# Patient Record
Sex: Female | Born: 1945 | Race: Black or African American | Hispanic: No | Smoking: Former smoker
Health system: Southern US, Community
[De-identification: ages and names within clinical notes are randomized; demographics above are authoritative.]

## PROBLEM LIST (undated history)

## (undated) DIAGNOSIS — N289 Disorder of kidney and ureter, unspecified: Secondary | ICD-10-CM

## (undated) DIAGNOSIS — I8290 Acute embolism and thrombosis of unspecified vein: Secondary | ICD-10-CM

## (undated) DIAGNOSIS — J45909 Unspecified asthma, uncomplicated: Secondary | ICD-10-CM

## (undated) DIAGNOSIS — M545 Low back pain, unspecified: Secondary | ICD-10-CM

## (undated) DIAGNOSIS — K219 Gastro-esophageal reflux disease without esophagitis: Secondary | ICD-10-CM

## (undated) DIAGNOSIS — M1990 Unspecified osteoarthritis, unspecified site: Secondary | ICD-10-CM

## (undated) DIAGNOSIS — Z8659 Personal history of other mental and behavioral disorders: Secondary | ICD-10-CM

## (undated) DIAGNOSIS — Z862 Personal history of diseases of the blood and blood-forming organs and certain disorders involving the immune mechanism: Secondary | ICD-10-CM

## (undated) DIAGNOSIS — C801 Malignant (primary) neoplasm, unspecified: Secondary | ICD-10-CM

## (undated) DIAGNOSIS — R519 Headache, unspecified: Secondary | ICD-10-CM

## (undated) DIAGNOSIS — Z8719 Personal history of other diseases of the digestive system: Secondary | ICD-10-CM

## (undated) DIAGNOSIS — R109 Unspecified abdominal pain: Secondary | ICD-10-CM

## (undated) DIAGNOSIS — R011 Cardiac murmur, unspecified: Secondary | ICD-10-CM

## (undated) DIAGNOSIS — R51 Headache: Secondary | ICD-10-CM

## (undated) DIAGNOSIS — I10 Essential (primary) hypertension: Secondary | ICD-10-CM

## (undated) DIAGNOSIS — I639 Cerebral infarction, unspecified: Secondary | ICD-10-CM

## (undated) DIAGNOSIS — E669 Obesity, unspecified: Secondary | ICD-10-CM

## (undated) HISTORY — PX: LEG SURGERY: SHX1003

## (undated) HISTORY — DX: Personal history of other mental and behavioral disorders: Z86.59

## (undated) HISTORY — PX: BILATERAL CARPAL TUNNEL RELEASE: SHX6508

## (undated) HISTORY — PX: UTERINE FIBROID SURGERY: SHX826

## (undated) HISTORY — DX: Personal history of diseases of the blood and blood-forming organs and certain disorders involving the immune mechanism: Z86.2

## (undated) HISTORY — DX: Obesity, unspecified: E66.9

## (undated) HISTORY — DX: Unspecified abdominal pain: R10.9

## (undated) HISTORY — PX: BREAST SURGERY: SHX581

## (undated) HISTORY — PX: GLAUCOMA SURGERY: SHX656

## (undated) HISTORY — DX: Low back pain: M54.5

## (undated) HISTORY — DX: Essential (primary) hypertension: I10

## (undated) HISTORY — DX: Unspecified asthma, uncomplicated: J45.909

## (undated) HISTORY — DX: Low back pain, unspecified: M54.50

---

## 1968-11-18 HISTORY — PX: BREAST EXCISIONAL BIOPSY: SUR124

## 1998-12-28 ENCOUNTER — Other Ambulatory Visit: Admission: RE | Admit: 1998-12-28 | Discharge: 1998-12-28 | Payer: Self-pay | Admitting: Internal Medicine

## 1999-04-19 ENCOUNTER — Encounter: Payer: Self-pay | Admitting: Internal Medicine

## 1999-04-19 ENCOUNTER — Ambulatory Visit (HOSPITAL_COMMUNITY): Admission: RE | Admit: 1999-04-19 | Discharge: 1999-04-19 | Payer: Self-pay | Admitting: Internal Medicine

## 1999-05-20 ENCOUNTER — Emergency Department (HOSPITAL_COMMUNITY): Admission: EM | Admit: 1999-05-20 | Discharge: 1999-05-20 | Payer: Self-pay | Admitting: Emergency Medicine

## 1999-07-19 ENCOUNTER — Encounter (INDEPENDENT_AMBULATORY_CARE_PROVIDER_SITE_OTHER): Payer: Self-pay | Admitting: Specialist

## 1999-07-19 ENCOUNTER — Other Ambulatory Visit: Admission: RE | Admit: 1999-07-19 | Discharge: 1999-07-19 | Payer: Self-pay | Admitting: Otolaryngology

## 2000-06-10 ENCOUNTER — Encounter: Payer: Self-pay | Admitting: Internal Medicine

## 2000-06-10 ENCOUNTER — Encounter: Admission: RE | Admit: 2000-06-10 | Discharge: 2000-06-10 | Payer: Self-pay | Admitting: Internal Medicine

## 2000-06-17 ENCOUNTER — Ambulatory Visit (HOSPITAL_COMMUNITY): Admission: RE | Admit: 2000-06-17 | Discharge: 2000-06-17 | Payer: Self-pay | Admitting: Internal Medicine

## 2001-03-26 ENCOUNTER — Other Ambulatory Visit: Admission: RE | Admit: 2001-03-26 | Discharge: 2001-03-26 | Payer: Self-pay | Admitting: Internal Medicine

## 2001-04-03 ENCOUNTER — Ambulatory Visit (HOSPITAL_BASED_OUTPATIENT_CLINIC_OR_DEPARTMENT_OTHER): Admission: RE | Admit: 2001-04-03 | Discharge: 2001-04-03 | Payer: Self-pay | Admitting: Internal Medicine

## 2001-06-23 ENCOUNTER — Ambulatory Visit (HOSPITAL_COMMUNITY): Admission: RE | Admit: 2001-06-23 | Discharge: 2001-06-23 | Payer: Self-pay | Admitting: Internal Medicine

## 2001-06-23 ENCOUNTER — Encounter: Payer: Self-pay | Admitting: Internal Medicine

## 2002-08-20 ENCOUNTER — Encounter: Payer: Self-pay | Admitting: Internal Medicine

## 2002-08-20 ENCOUNTER — Ambulatory Visit (HOSPITAL_COMMUNITY): Admission: RE | Admit: 2002-08-20 | Discharge: 2002-08-20 | Payer: Self-pay | Admitting: Internal Medicine

## 2003-06-15 ENCOUNTER — Ambulatory Visit (HOSPITAL_BASED_OUTPATIENT_CLINIC_OR_DEPARTMENT_OTHER): Admission: RE | Admit: 2003-06-15 | Discharge: 2003-06-15 | Payer: Self-pay | Admitting: Orthopedic Surgery

## 2003-08-31 ENCOUNTER — Ambulatory Visit (HOSPITAL_COMMUNITY): Admission: RE | Admit: 2003-08-31 | Discharge: 2003-08-31 | Payer: Self-pay | Admitting: Internal Medicine

## 2003-08-31 ENCOUNTER — Encounter: Payer: Self-pay | Admitting: Internal Medicine

## 2004-09-19 ENCOUNTER — Ambulatory Visit (HOSPITAL_COMMUNITY): Admission: RE | Admit: 2004-09-19 | Discharge: 2004-09-19 | Payer: Self-pay | Admitting: Internal Medicine

## 2004-12-11 ENCOUNTER — Other Ambulatory Visit: Admission: RE | Admit: 2004-12-11 | Discharge: 2004-12-11 | Payer: Self-pay | Admitting: Internal Medicine

## 2005-02-19 ENCOUNTER — Ambulatory Visit (HOSPITAL_COMMUNITY): Admission: RE | Admit: 2005-02-19 | Discharge: 2005-02-19 | Payer: Self-pay | Admitting: Gastroenterology

## 2005-10-14 ENCOUNTER — Ambulatory Visit (HOSPITAL_COMMUNITY): Admission: RE | Admit: 2005-10-14 | Discharge: 2005-10-14 | Payer: Self-pay | Admitting: Internal Medicine

## 2006-12-23 ENCOUNTER — Ambulatory Visit (HOSPITAL_COMMUNITY): Admission: RE | Admit: 2006-12-23 | Discharge: 2006-12-23 | Payer: Self-pay | Admitting: Internal Medicine

## 2007-12-29 ENCOUNTER — Ambulatory Visit (HOSPITAL_COMMUNITY): Admission: RE | Admit: 2007-12-29 | Discharge: 2007-12-29 | Payer: Self-pay | Admitting: Internal Medicine

## 2009-01-10 ENCOUNTER — Ambulatory Visit (HOSPITAL_COMMUNITY): Admission: RE | Admit: 2009-01-10 | Discharge: 2009-01-10 | Payer: Self-pay | Admitting: Internal Medicine

## 2009-01-24 ENCOUNTER — Encounter: Admission: RE | Admit: 2009-01-24 | Discharge: 2009-01-24 | Payer: Self-pay | Admitting: Internal Medicine

## 2009-03-08 ENCOUNTER — Encounter (HOSPITAL_COMMUNITY): Admission: RE | Admit: 2009-03-08 | Discharge: 2009-06-06 | Payer: Self-pay | Admitting: Cardiology

## 2009-05-25 ENCOUNTER — Emergency Department (HOSPITAL_COMMUNITY): Admission: EM | Admit: 2009-05-25 | Discharge: 2009-05-25 | Payer: Self-pay | Admitting: Emergency Medicine

## 2010-01-11 ENCOUNTER — Ambulatory Visit (HOSPITAL_COMMUNITY): Admission: RE | Admit: 2010-01-11 | Discharge: 2010-01-11 | Payer: Self-pay | Admitting: Internal Medicine

## 2010-12-03 ENCOUNTER — Encounter
Admission: RE | Admit: 2010-12-03 | Discharge: 2010-12-03 | Payer: Self-pay | Source: Home / Self Care | Attending: Internal Medicine | Admitting: Internal Medicine

## 2010-12-14 ENCOUNTER — Other Ambulatory Visit: Payer: Self-pay | Admitting: Internal Medicine

## 2010-12-14 DIAGNOSIS — Z139 Encounter for screening, unspecified: Secondary | ICD-10-CM

## 2010-12-14 DIAGNOSIS — Z1231 Encounter for screening mammogram for malignant neoplasm of breast: Secondary | ICD-10-CM

## 2011-01-31 ENCOUNTER — Ambulatory Visit (HOSPITAL_COMMUNITY)
Admission: RE | Admit: 2011-01-31 | Discharge: 2011-01-31 | Disposition: A | Discharge status: Home / Self Care | Payer: Medicare Other | Source: Ambulatory Visit | Attending: Internal Medicine | Admitting: Internal Medicine

## 2011-01-31 DIAGNOSIS — Z1231 Encounter for screening mammogram for malignant neoplasm of breast: Secondary | ICD-10-CM | POA: Insufficient documentation

## 2011-02-24 LAB — URINALYSIS, ROUTINE W REFLEX MICROSCOPIC
Bilirubin Urine: NEGATIVE
Glucose, UA: 100 mg/dL — AB
Hgb urine dipstick: NEGATIVE
Ketones, ur: NEGATIVE mg/dL
Leukocytes, UA: NEGATIVE
Nitrite: NEGATIVE
Protein, ur: 100 mg/dL — AB
Specific Gravity, Urine: 1.017 (ref 1.005–1.030)
Urobilinogen, UA: 1 mg/dL (ref 0.0–1.0)
pH: 6.5 (ref 5.0–8.0)

## 2011-02-24 LAB — CBC
HCT: 40.4 % (ref 36.0–46.0)
Hemoglobin: 14.1 g/dL (ref 12.0–15.0)
MCHC: 34.7 g/dL (ref 30.0–36.0)
MCHC: 34.9 g/dL (ref 30.0–36.0)
MCV: 86 fL (ref 78.0–100.0)
Platelets: 314 10*3/uL (ref 150–400)
RBC: 4.7 MIL/uL (ref 3.87–5.11)
RDW: 14.3 % (ref 11.5–15.5)
WBC: 10 10*3/uL (ref 4.0–10.5)

## 2011-02-24 LAB — DIFFERENTIAL
Band Neutrophils: 0 % (ref 0–10)
Basophils Absolute: 0 10*3/uL (ref 0.0–0.1)
Basophils Absolute: 0 10*3/uL (ref 0.0–0.1)
Basophils Relative: 0 % (ref 0–1)
Basophils Relative: 0 % (ref 0–1)
Blasts: 0 %
Eosinophils Absolute: 0 10*3/uL (ref 0.0–0.7)
Eosinophils Relative: 0 % (ref 0–5)
Eosinophils Relative: 0 % (ref 0–5)
Lymphocytes Relative: 18 % (ref 12–46)
Lymphocytes Relative: 7 % — ABNORMAL LOW (ref 12–46)
Lymphs Abs: 1.8 10*3/uL (ref 0.7–4.0)
Metamyelocytes Relative: 0 %
Monocytes Absolute: 0.5 10*3/uL (ref 0.1–1.0)
Monocytes Absolute: 0.6 10*3/uL (ref 0.1–1.0)
Monocytes Relative: 5 % (ref 3–12)
Monocytes Relative: 5 % (ref 3–12)
Myelocytes: 0 %
Neutro Abs: 7.7 10*3/uL (ref 1.7–7.7)
Neutrophils Relative %: 77 % (ref 43–77)
Promyelocytes Absolute: 0 %
Smear Review: ADEQUATE
nRBC: 0 /100 WBC

## 2011-02-24 LAB — COMPREHENSIVE METABOLIC PANEL
AST: 23 U/L (ref 0–37)
Albumin: 3.9 g/dL (ref 3.5–5.2)
Alkaline Phosphatase: 69 U/L (ref 39–117)
Chloride: 102 mEq/L (ref 96–112)
Creatinine, Ser: 0.59 mg/dL (ref 0.4–1.2)
GFR calc Af Amer: >60 mL/min (ref >60)
GFR calc non Af Amer: >60 mL/min (ref >60)
Potassium: 3.4 mEq/L — ABNORMAL LOW (ref 3.5–5.1)
Sodium: 143 mEq/L (ref 135–145)

## 2011-02-24 LAB — URINE MICROSCOPIC-ADD ON

## 2011-04-05 NOTE — Op Note (Signed)
Deanna Rose, Deanna Rose          ACCOUNT NO.:  [id number]   MEDICAL RECORD NO.:  [id number]          PATIENT TYPE:  AMB   LOCATION:  ENDO                         FACILITY:  [facility]   PHYSICIAN:  James L. Malon Kindle., M.D.DATE OF BIRTH:  Jan 07, 1946   DATE OF PROCEDURE:  02/19/2005  DATE OF DISCHARGE:                                 OPERATIVE REPORT   PROCEDURE:  Colonoscopy.   ENDOSCOPIST:  Llana Aliment. Edwards, M.D.   MEDICATIONS:  Fentanyl 80 mcg, Versed 8 mg IV.   SCOPE:  Olympus pediatric adjustable colonoscope.   INDICATIONS FOR PROCEDURE:  Colon cancer screening.   DESCRIPTION OF PROCEDURE:  The procedure had been explained to the patient  and a consent obtained.  With the patient in the left lateral decubitus  position, the Olympus scope was inserted and advanced.  The prep was  excellent.  We reached the cecum without difficulty.  The ileocecal valve  and appendiceal orifice were seen.  The scope was withdrawn in the cecum.  The ascending colon, transverse colon, splenic flexure, descending and  sigmoid colon were seen well.  No polyps were seen.  No significant  diverticulosis.  The rectum was free of polys.  The scope was withdrawn.   The patient tolerated the procedure well.   ASSESSMENT:  Normal screening colonoscopy - code #V76.51.   PLAN:  Will recommend yearly Hemoccults.  Possibly repeat the procedure in  10 years, or for specific indications.      JLE/MEDQ  D:  02/19/2005  T:  02/19/2005  Job:  [id number]   cc:   Minerva Areola L. August Saucer, M.D.  [address] [address]  [place]  [place] [address]  Fax: [phone]

## 2011-04-05 NOTE — Op Note (Signed)
   NAME:  Deanna Rose, Deanna Rose                    ACCOUNT NO.:  [id number]   MEDICAL RECORD NO.:  [id number]                   PATIENT TYPE:  AMB   LOCATION:  DSC                                  FACILITY:  [facility]   PHYSICIAN:  Artist Pais. Mina Marble, M.D.           DATE OF BIRTH:  08-05-1946   DATE OF PROCEDURE:  06/15/2003  DATE OF DISCHARGE:                                 OPERATIVE REPORT   PREOPERATIVE DIAGNOSIS:  Left carpal tunnel syndrome.   POSTOPERATIVE DIAGNOSIS:  Left carpal tunnel syndrome.   PROCEDURE:  Left carpal tunnel release.   SURGEON:  Artist Pais. Mina Marble, M.D.   ASSISTANT:  Orlin Hilding, PA   ANESTHESIA:  Bier block.   TOURNIQUET TIME:  Twenty minutes.   COMPLICATIONS:  No complications.   DRAINS:  No drains.   OPERATIVE REPORT:  The patient was taken to the operating room.  After  induction of adequate Bier block analgesia, the left upper extremity was  prepped and draped in the usual sterile fashion.  After this was done, a 2  cm incision was made on the palmar aspect of the left hand in line along the  __________  Kaplan's cardinal line.  Suture was taken down through the skin  and subcutaneous tissues until the palmar fascia was identified.  The palmar  fascia was split, thus exposing the distal edge of the transverse carpal  ligament and the superficial palmar arch.  Superficial palmar arch was  tracked distally and the distal edge of the transverse carpal ligament was  divided with a 15 blade.  This exposed an underlying median nerve in contact  with the carpal canal.  A  Freer elevator was then used to retract the  median nerve and the remaining aspects of the transverse carpal ligament  were divided by direct vision.  The canal was inspected and no osseous  lesions or ganglion present.  It was irrigated and this was closed with  running 3-0 Prolene subcuticular stitch.  Steri-Strips, 4x4 fluff hand  dressing were applied.  The patient tolerated the  procedure well and went to  the room in a stable condition.                                               Artist Pais Mina Marble, M.D.    MAW/MEDQ  D:  06/15/2003  T:  06/15/2003  Job:  [id number]

## 2011-09-03 ENCOUNTER — Other Ambulatory Visit: Payer: Self-pay | Admitting: Internal Medicine

## 2011-09-03 DIAGNOSIS — R1011 Right upper quadrant pain: Secondary | ICD-10-CM

## 2011-09-05 ENCOUNTER — Ambulatory Visit
Admission: RE | Admit: 2011-09-05 | Discharge: 2011-09-05 | Disposition: A | Discharge status: Home / Self Care | Payer: Medicare Other | Source: Ambulatory Visit | Attending: Internal Medicine | Admitting: Internal Medicine

## 2011-09-05 DIAGNOSIS — R1011 Right upper quadrant pain: Secondary | ICD-10-CM

## 2012-01-13 ENCOUNTER — Other Ambulatory Visit: Payer: Self-pay | Admitting: Internal Medicine

## 2012-01-13 DIAGNOSIS — Z1231 Encounter for screening mammogram for malignant neoplasm of breast: Secondary | ICD-10-CM

## 2012-02-06 ENCOUNTER — Ambulatory Visit (HOSPITAL_COMMUNITY)
Admission: RE | Admit: 2012-02-06 | Discharge: 2012-02-06 | Disposition: A | Discharge status: Home / Self Care | Payer: Medicare Other | Source: Ambulatory Visit | Attending: Internal Medicine | Admitting: Internal Medicine

## 2012-02-06 DIAGNOSIS — Z1231 Encounter for screening mammogram for malignant neoplasm of breast: Secondary | ICD-10-CM | POA: Insufficient documentation

## 2013-01-18 ENCOUNTER — Other Ambulatory Visit (HOSPITAL_COMMUNITY): Payer: Self-pay | Admitting: Internal Medicine

## 2013-01-18 DIAGNOSIS — Z1231 Encounter for screening mammogram for malignant neoplasm of breast: Secondary | ICD-10-CM

## 2013-01-20 DIAGNOSIS — Z8659 Personal history of other mental and behavioral disorders: Secondary | ICD-10-CM | POA: Insufficient documentation

## 2013-01-20 DIAGNOSIS — M545 Low back pain, unspecified: Secondary | ICD-10-CM | POA: Insufficient documentation

## 2013-01-20 DIAGNOSIS — I10 Essential (primary) hypertension: Secondary | ICD-10-CM | POA: Insufficient documentation

## 2013-01-20 DIAGNOSIS — R109 Unspecified abdominal pain: Secondary | ICD-10-CM | POA: Insufficient documentation

## 2013-02-16 ENCOUNTER — Ambulatory Visit (HOSPITAL_COMMUNITY)
Admission: RE | Admit: 2013-02-16 | Discharge: 2013-02-16 | Disposition: A | Discharge status: Home / Self Care | Payer: Medicare Other | Source: Ambulatory Visit | Attending: Internal Medicine | Admitting: Internal Medicine

## 2013-02-16 DIAGNOSIS — Z1231 Encounter for screening mammogram for malignant neoplasm of breast: Secondary | ICD-10-CM | POA: Insufficient documentation

## 2014-02-11 ENCOUNTER — Other Ambulatory Visit (HOSPITAL_COMMUNITY): Payer: Self-pay | Admitting: Internal Medicine

## 2014-02-11 DIAGNOSIS — Z1231 Encounter for screening mammogram for malignant neoplasm of breast: Secondary | ICD-10-CM

## 2014-02-24 ENCOUNTER — Ambulatory Visit (HOSPITAL_COMMUNITY)
Admission: RE | Admit: 2014-02-24 | Discharge: 2014-02-24 | Disposition: A | Discharge status: Home / Self Care | Payer: Medicare Other | Source: Ambulatory Visit | Attending: Internal Medicine | Admitting: Internal Medicine

## 2014-02-24 DIAGNOSIS — Z1231 Encounter for screening mammogram for malignant neoplasm of breast: Secondary | ICD-10-CM | POA: Insufficient documentation

## 2015-03-07 ENCOUNTER — Other Ambulatory Visit (HOSPITAL_COMMUNITY): Payer: Self-pay | Admitting: Internal Medicine

## 2015-03-07 DIAGNOSIS — Z1231 Encounter for screening mammogram for malignant neoplasm of breast: Secondary | ICD-10-CM

## 2015-03-30 ENCOUNTER — Ambulatory Visit (HOSPITAL_COMMUNITY)
Admission: RE | Admit: 2015-03-30 | Discharge: 2015-03-30 | Disposition: A | Discharge status: Home / Self Care | Payer: Medicare Other | Source: Ambulatory Visit | Attending: Internal Medicine | Admitting: Internal Medicine

## 2015-03-30 DIAGNOSIS — Z1231 Encounter for screening mammogram for malignant neoplasm of breast: Secondary | ICD-10-CM

## 2015-12-01 ENCOUNTER — Other Ambulatory Visit (HOSPITAL_COMMUNITY): Payer: Self-pay | Admitting: Internal Medicine

## 2015-12-01 ENCOUNTER — Ambulatory Visit (HOSPITAL_COMMUNITY)
Admission: RE | Admit: 2015-12-01 | Discharge: 2015-12-01 | Disposition: A | Discharge status: Home / Self Care | Payer: Medicare Other | Source: Ambulatory Visit | Attending: Internal Medicine | Admitting: Internal Medicine

## 2015-12-01 ENCOUNTER — Other Ambulatory Visit (HOSPITAL_COMMUNITY): Payer: Self-pay | Admitting: Orthopedic Surgery

## 2015-12-01 DIAGNOSIS — M7989 Other specified soft tissue disorders: Secondary | ICD-10-CM | POA: Insufficient documentation

## 2015-12-01 DIAGNOSIS — R52 Pain, unspecified: Secondary | ICD-10-CM

## 2015-12-01 DIAGNOSIS — M79605 Pain in left leg: Secondary | ICD-10-CM | POA: Diagnosis not present

## 2015-12-01 DIAGNOSIS — R0602 Shortness of breath: Secondary | ICD-10-CM | POA: Insufficient documentation

## 2015-12-01 NOTE — Progress Notes (Signed)
*  Preliminary Results* Left lower extremity venous duplex completed. Left lower extremity is negative for deep vein thrombosis. There is no evidence of left Baker's cyst.  12/01/2015 6:18 PM  Maudry Mayhew, RVT, RDCS, RDMS

## 2015-12-06 ENCOUNTER — Ambulatory Visit
Admission: RE | Admit: 2015-12-06 | Discharge: 2015-12-06 | Disposition: A | Discharge status: Home / Self Care | Payer: Medicare Other | Source: Ambulatory Visit | Attending: Internal Medicine | Admitting: Internal Medicine

## 2015-12-06 ENCOUNTER — Other Ambulatory Visit: Payer: Self-pay | Admitting: Internal Medicine

## 2015-12-06 DIAGNOSIS — R0602 Shortness of breath: Secondary | ICD-10-CM

## 2015-12-25 ENCOUNTER — Ambulatory Visit
Admission: RE | Admit: 2015-12-25 | Discharge: 2015-12-25 | Disposition: A | Discharge status: Home / Self Care | Payer: Medicare Other | Source: Ambulatory Visit | Attending: Internal Medicine | Admitting: Internal Medicine

## 2015-12-25 ENCOUNTER — Other Ambulatory Visit: Payer: Self-pay | Admitting: Internal Medicine

## 2015-12-25 DIAGNOSIS — R52 Pain, unspecified: Secondary | ICD-10-CM

## 2016-02-12 ENCOUNTER — Other Ambulatory Visit: Payer: Self-pay | Admitting: Neurosurgery

## 2016-02-28 ENCOUNTER — Encounter (HOSPITAL_COMMUNITY): Payer: Self-pay

## 2016-02-28 ENCOUNTER — Encounter (HOSPITAL_COMMUNITY)
Admission: RE | Admit: 2016-02-28 | Discharge: 2016-02-28 | Disposition: A | Discharge status: Home / Self Care | Payer: Medicare Other | Source: Ambulatory Visit | Attending: Neurosurgery | Admitting: Neurosurgery

## 2016-02-28 DIAGNOSIS — Z01812 Encounter for preprocedural laboratory examination: Secondary | ICD-10-CM | POA: Insufficient documentation

## 2016-02-28 DIAGNOSIS — Z0183 Encounter for blood typing: Secondary | ICD-10-CM | POA: Diagnosis not present

## 2016-02-28 DIAGNOSIS — M4806 Spinal stenosis, lumbar region: Secondary | ICD-10-CM | POA: Insufficient documentation

## 2016-02-28 DIAGNOSIS — I444 Left anterior fascicular block: Secondary | ICD-10-CM | POA: Diagnosis not present

## 2016-02-28 DIAGNOSIS — I10 Essential (primary) hypertension: Secondary | ICD-10-CM | POA: Diagnosis not present

## 2016-02-28 DIAGNOSIS — R9431 Abnormal electrocardiogram [ECG] [EKG]: Secondary | ICD-10-CM | POA: Diagnosis not present

## 2016-02-28 DIAGNOSIS — Z01818 Encounter for other preprocedural examination: Secondary | ICD-10-CM | POA: Diagnosis present

## 2016-02-28 HISTORY — DX: Malignant (primary) neoplasm, unspecified: C80.1

## 2016-02-28 HISTORY — DX: Unspecified osteoarthritis, unspecified site: M19.90

## 2016-02-28 HISTORY — DX: Headache: R51

## 2016-02-28 HISTORY — DX: Headache, unspecified: R51.9

## 2016-02-28 HISTORY — DX: Personal history of other diseases of the digestive system: Z87.19

## 2016-02-28 HISTORY — DX: Cardiac murmur, unspecified: R01.1

## 2016-02-28 HISTORY — DX: Cerebral infarction, unspecified: I63.9

## 2016-02-28 HISTORY — DX: Acute embolism and thrombosis of unspecified vein: I82.90

## 2016-02-28 HISTORY — DX: Gastro-esophageal reflux disease without esophagitis: K21.9

## 2016-02-28 LAB — BASIC METABOLIC PANEL
Anion gap: 12 (ref 5–15)
BUN: 16 mg/dL (ref 6–20)
CO2: 27 mmol/L (ref 22–32)
Calcium: 10.5 mg/dL — ABNORMAL HIGH (ref 8.9–10.3)
Chloride: 101 mmol/L (ref 101–111)
Creatinine, Ser: 0.84 mg/dL (ref 0.44–1.00)
GFR calc Af Amer: >60 mL/min (ref >60)
GFR calc non Af Amer: >60 mL/min (ref >60)
Glucose, Bld: 105 mg/dL — ABNORMAL HIGH (ref 65–99)
Potassium: 3.6 mmol/L (ref 3.5–5.1)
Sodium: 140 mmol/L (ref 135–145)

## 2016-02-28 LAB — SURGICAL PCR SCREEN
MRSA, PCR: POSITIVE — AB
Staphylococcus aureus: POSITIVE — AB

## 2016-02-28 LAB — CBC
HCT: 36.6 % (ref 36.0–46.0)
Hemoglobin: 12.3 g/dL (ref 12.0–15.0)
MCH: 28.5 pg (ref 26.0–34.0)
MCHC: 33.6 g/dL (ref 30.0–36.0)
MCV: 84.9 fL (ref 78.0–100.0)
Platelets: 397 10*3/uL (ref 150–400)
RBC: 4.31 MIL/uL (ref 3.87–5.11)
RDW: 14 % (ref 11.5–15.5)
WBC: 10.4 10*3/uL (ref 4.0–10.5)

## 2016-02-28 NOTE — Progress Notes (Signed)
Mupirocin Ointment Rx called into [facility] on [address] for positive PCR of MRSA and Staph. Left message notifying pt of results and need to pick up Rx.

## 2016-02-28 NOTE — Pre-Procedure Instructions (Signed)
Deanna Rose  02/28/2016      [facility] - [place] ([place]), [place] [id number] [place] ([place]) [place] [address] Phone: [phone] Fax: [phone]    Your procedure is scheduled on Thursday  03/07/16.  Report to [facility] Admitting at 800 A.M.  Call this number if you have problems the morning of surgery:  [phone]   Remember:  Do not eat food or drink liquids after midnight.  Take these medicines the morning of surgery with A SIP OF WATER   ALBUTEROL INHALER, AMLODIPINE (NORVASC), GABAPENTIN, OMEPRAZOLE, TYLENOL IF NEEDED     (STOP ASPIRIN, COUMADIN ,PLAVIX, EFFIENT, HERBAL MEDICINES, IBUPROFEN/ ADVIL/ MOTRIN, KRILL OIL, MULTIVITAMIN, VIT E, GOODY POWDERS/ BC'S)   Do not wear jewelry, make-up or nail polish.  Do not wear lotions, powders, or perfumes.  You may wear deodorant.  Do not shave 48 hours prior to surgery.  Men may shave face and neck.  Do not bring valuables to the hospital.  [facility] is not responsible for any belongings or valuables.  Contacts, dentures or bridgework may not be worn into surgery.  Leave your suitcase in the car.  After surgery it may be brought to your room.  For patients admitted to the hospital, discharge time will be determined by your treatment team.  Patients discharged the day of surgery will not be allowed to drive home.   Name and phone number of your driver:    Special instructions:  [facility] - Preparing for Surgery  Before surgery, you can play an important role.  Because skin is not sterile, your skin needs to be as free of germs as possible.  You can reduce the number of germs on you skin by washing with CHG (chlorahexidine gluconate) soap before surgery.  CHG is an antiseptic cleaner which kills germs and bonds with the skin to continue killing germs even after washing.  Please DO NOT use if you have an allergy to CHG or antibacterial soaps.  If your skin becomes  reddened/irritated stop using the CHG and inform your nurse when you arrive at Short Stay.  Do not shave (including legs and underarms) for at least 48 hours prior to the first CHG shower.  You may shave your face.  Please follow these instructions carefully:   1.  Shower with CHG Soap the night before surgery and the                                morning of Surgery.  2.  If you choose to wash your hair, wash your hair first as usual with your       normal shampoo.  3.  After you shampoo, rinse your hair and body thoroughly to remove the                      Shampoo.  4.  Use CHG as you would any other liquid soap.  You can apply chg directly       to the skin and wash gently with scrungie or a clean washcloth.  5.  Apply the CHG Soap to your body ONLY FROM THE NECK DOWN.        Do not use on open wounds or open sores.  Avoid contact with your eyes,       ears, mouth and genitals (private parts).  Wash  genitals (private parts)       with your normal soap.  6.  Wash thoroughly, paying special attention to the area where your surgery        will be performed.  7.  Thoroughly rinse your body with warm water from the neck down.  8.  DO NOT shower/wash with your normal soap after using and rinsing off       the CHG Soap.  9.  Pat yourself dry with a clean towel.            10.  Wear clean pajamas.            11.  Place clean sheets on your bed the night of your first shower and do not        sleep with pets.  Day of Surgery  Do not apply any lotions/deoderants the morning of surgery.  Please wear clean clothes to the hospital/surgery center.    Please read over the following fact sheets that you were given. Pain Booklet, Coughing and Deep Breathing, Blood Transfusion Information, MRSA Information and Surgical Site Infection Prevention

## 2016-03-06 NOTE — Anesthesia Preprocedure Evaluation (Addendum)
Anesthesia Evaluation  Patient identified by MRN, date of birth, ID band Patient awake    Reviewed: Allergy & Precautions, NPO status , Patient's Chart, lab work & pertinent test results  Airway Mallampati: II   Neck ROM: Full    Dental  (+) Teeth Intact, Dental Advisory Given   Pulmonary asthma (inhalers) ,    breath sounds clear to auscultation       Cardiovascular hypertension, Pt. on medications negative cardio ROS   Rhythm:Regular     Neuro/Psych  Headaches, Depression negative neurological ROS     GI/Hepatic negative GI ROS, Neg liver ROS, GERD  Medicated,  Endo/Other  negative endocrine ROS  Renal/GU negative Renal ROS  negative genitourinary   Musculoskeletal negative musculoskeletal ROS (+)   Abdominal   Peds negative pediatric ROS (+)  Hematology negative hematology ROS (+) 12/36   Anesthesia Other Findings   Reproductive/Obstetrics negative OB ROS                            Anesthesia Physical Anesthesia Plan  ASA: II  Anesthesia Plan: General   Post-op Pain Management:    Induction: Intravenous  Airway Management Planned: Oral ETT  Additional Equipment:   Intra-op Plan:   Post-operative Plan: Extubation in OR  Informed Consent: I have reviewed the patients History and Physical, chart, labs and discussed the procedure including the risks, benefits and alternatives for the proposed anesthesia with the patient or authorized representative who has indicated his/her understanding and acceptance.     Plan Discussed with:   Anesthesia Plan Comments: (2nd IV after turning, convert T & S to T & C if significant blood loss)        Anesthesia Quick Evaluation

## 2016-03-07 ENCOUNTER — Inpatient Hospital Stay (HOSPITAL_COMMUNITY): Payer: Medicare Other | Admitting: Anesthesiology

## 2016-03-07 ENCOUNTER — Encounter (HOSPITAL_COMMUNITY): Payer: Self-pay | Admitting: Anesthesiology

## 2016-03-07 ENCOUNTER — Observation Stay (HOSPITAL_COMMUNITY)
Admission: RE | Admit: 2016-03-07 | Discharge: 2016-03-08 | Disposition: A | Discharge status: Home / Self Care | Payer: Medicare Other | Source: Ambulatory Visit | Attending: Neurosurgery | Admitting: Neurosurgery

## 2016-03-07 ENCOUNTER — Encounter (HOSPITAL_COMMUNITY)
Admission: RE | Disposition: A | Discharge status: Home / Self Care | Payer: Self-pay | Source: Ambulatory Visit | Attending: Neurosurgery

## 2016-03-07 ENCOUNTER — Inpatient Hospital Stay (HOSPITAL_COMMUNITY): Payer: Medicare Other

## 2016-03-07 DIAGNOSIS — E669 Obesity, unspecified: Secondary | ICD-10-CM | POA: Insufficient documentation

## 2016-03-07 DIAGNOSIS — F329 Major depressive disorder, single episode, unspecified: Secondary | ICD-10-CM | POA: Diagnosis not present

## 2016-03-07 DIAGNOSIS — M4316 Spondylolisthesis, lumbar region: Secondary | ICD-10-CM | POA: Diagnosis not present

## 2016-03-07 DIAGNOSIS — J45909 Unspecified asthma, uncomplicated: Secondary | ICD-10-CM | POA: Insufficient documentation

## 2016-03-07 DIAGNOSIS — I10 Essential (primary) hypertension: Secondary | ICD-10-CM | POA: Diagnosis not present

## 2016-03-07 DIAGNOSIS — M5136 Other intervertebral disc degeneration, lumbar region: Secondary | ICD-10-CM | POA: Insufficient documentation

## 2016-03-07 DIAGNOSIS — M4806 Spinal stenosis, lumbar region: Secondary | ICD-10-CM | POA: Diagnosis not present

## 2016-03-07 DIAGNOSIS — K219 Gastro-esophageal reflux disease without esophagitis: Secondary | ICD-10-CM | POA: Insufficient documentation

## 2016-03-07 DIAGNOSIS — Z8673 Personal history of transient ischemic attack (TIA), and cerebral infarction without residual deficits: Secondary | ICD-10-CM | POA: Insufficient documentation

## 2016-03-07 DIAGNOSIS — Z6829 Body mass index (BMI) 29.0-29.9, adult: Secondary | ICD-10-CM | POA: Insufficient documentation

## 2016-03-07 DIAGNOSIS — M47816 Spondylosis without myelopathy or radiculopathy, lumbar region: Secondary | ICD-10-CM | POA: Diagnosis not present

## 2016-03-07 DIAGNOSIS — Z79899 Other long term (current) drug therapy: Secondary | ICD-10-CM | POA: Diagnosis not present

## 2016-03-07 DIAGNOSIS — M48062 Spinal stenosis, lumbar region with neurogenic claudication: Secondary | ICD-10-CM | POA: Diagnosis present

## 2016-03-07 DIAGNOSIS — Z419 Encounter for procedure for purposes other than remedying health state, unspecified: Secondary | ICD-10-CM

## 2016-03-07 LAB — TYPE AND SCREEN
ABO/RH(D): AB POS
ANTIBODY SCREEN: POSITIVE

## 2016-03-07 SURGERY — POSTERIOR LUMBAR FUSION 1 LEVEL
Anesthesia: General | Site: Back

## 2016-03-07 MED ORDER — ARTIFICIAL TEARS OP OINT
TOPICAL_OINTMENT | OPHTHALMIC | Status: AC
  Filled 2016-03-07: qty 3.5

## 2016-03-07 MED ORDER — KETOROLAC TROMETHAMINE 30 MG/ML IJ SOLN
15.0000 mg | Freq: Four times a day (QID) | INTRAMUSCULAR | Status: DC
Start: 2016-03-07 — End: 2016-03-08
  Administered 2016-03-07 – 2016-03-08 (×3): 15 mg via INTRAVENOUS
  Filled 2016-03-07 (×3): qty 1

## 2016-03-07 MED ORDER — OXYCODONE-ACETAMINOPHEN 5-325 MG PO TABS: 1.0000 | ORAL_TABLET | ORAL | Status: DC | PRN

## 2016-03-07 MED ORDER — ACETAMINOPHEN 325 MG PO TABS: 650.0000 mg | ORAL_TABLET | ORAL | Status: DC | PRN

## 2016-03-07 MED ORDER — MEPERIDINE HCL 25 MG/ML IJ SOLN: 6.2500 mg | INTRAMUSCULAR | Status: DC | PRN

## 2016-03-07 MED ORDER — ROCURONIUM BROMIDE 50 MG/5ML IV SOLN
INTRAVENOUS | Status: AC
  Filled 2016-03-07: qty 1

## 2016-03-07 MED ORDER — ACETAMINOPHEN 650 MG RE SUPP: 650.0000 mg | RECTAL | Status: DC | PRN

## 2016-03-07 MED ORDER — SODIUM CHLORIDE 0.9% FLUSH: 3.0000 mL | INTRAVENOUS | Status: DC | PRN

## 2016-03-07 MED ORDER — CEFAZOLIN SODIUM-DEXTROSE 2-4 GM/100ML-% IV SOLN
2.0000 g | INTRAVENOUS | Status: AC
  Administered 2016-03-07 (×2): 2 g via INTRAVENOUS
  Filled 2016-03-07 (×3): qty 100

## 2016-03-07 MED ORDER — FENTANYL CITRATE (PF) 100 MCG/2ML IJ SOLN
INTRAMUSCULAR | Status: DC | PRN
  Administered 2016-03-07: 50 ug via INTRAVENOUS
  Administered 2016-03-07: 100 ug via INTRAVENOUS
  Administered 2016-03-07: 50 ug via INTRAVENOUS
  Administered 2016-03-07 (×3): 100 ug via INTRAVENOUS

## 2016-03-07 MED ORDER — DEXAMETHASONE SODIUM PHOSPHATE 10 MG/ML IJ SOLN
INTRAMUSCULAR | Status: AC
  Filled 2016-03-07: qty 1

## 2016-03-07 MED ORDER — ONDANSETRON HCL 4 MG/2ML IJ SOLN: 4.0000 mg | Freq: Once | INTRAMUSCULAR | Status: DC

## 2016-03-07 MED ORDER — CITALOPRAM HYDROBROMIDE 20 MG PO TABS
20.0000 mg | ORAL_TABLET | Freq: Every day | ORAL | Status: DC
  Administered 2016-03-07: 20 mg via ORAL
  Filled 2016-03-07 (×2): qty 1

## 2016-03-07 MED ORDER — DEXTROSE 5 % IV SOLN
INTRAVENOUS | Status: DC | PRN
  Administered 2016-03-07: 11:00:00 via INTRAVENOUS

## 2016-03-07 MED ORDER — LACTATED RINGERS IV SOLN
INTRAVENOUS | Status: DC
  Administered 2016-03-07: 09:00:00 via INTRAVENOUS

## 2016-03-07 MED ORDER — LACTATED RINGERS IV SOLN
INTRAVENOUS | Status: DC | PRN
  Administered 2016-03-07 (×3): via INTRAVENOUS

## 2016-03-07 MED ORDER — MORPHINE SULFATE (PF) 4 MG/ML IV SOLN: 4.0000 mg | INTRAVENOUS | Status: DC | PRN

## 2016-03-07 MED ORDER — GELATIN ABSORBABLE MT POWD
OROMUCOSAL | Status: DC | PRN
  Administered 2016-03-07: 14:00:00 via TOPICAL

## 2016-03-07 MED ORDER — ESMOLOL HCL 100 MG/10ML IV SOLN
INTRAVENOUS | Status: DC | PRN
  Administered 2016-03-07: 40 mg via INTRAVENOUS

## 2016-03-07 MED ORDER — KETOROLAC TROMETHAMINE 30 MG/ML IJ SOLN: 15.0000 mg | Freq: Once | INTRAMUSCULAR | Status: DC

## 2016-03-07 MED ORDER — HYDROXYZINE HCL 50 MG/ML IM SOLN: 50.0000 mg | INTRAMUSCULAR | Status: DC | PRN

## 2016-03-07 MED ORDER — BISACODYL 10 MG RE SUPP: 10.0000 mg | Freq: Every day | RECTAL | Status: DC | PRN

## 2016-03-07 MED ORDER — CYCLOBENZAPRINE HCL 10 MG PO TABS: 10.0000 mg | ORAL_TABLET | Freq: Three times a day (TID) | ORAL | Status: DC | PRN

## 2016-03-07 MED ORDER — HYDROXYZINE HCL 25 MG PO TABS: 50.0000 mg | ORAL_TABLET | ORAL | Status: DC | PRN

## 2016-03-07 MED ORDER — PROPOFOL 10 MG/ML IV BOLUS
INTRAVENOUS | Status: AC
  Filled 2016-03-07: qty 20

## 2016-03-07 MED ORDER — ACETAMINOPHEN 10 MG/ML IV SOLN
INTRAVENOUS | Status: AC
  Administered 2016-03-07: 1000 mg via INTRAVENOUS
  Filled 2016-03-07: qty 100

## 2016-03-07 MED ORDER — ONDANSETRON HCL 4 MG/2ML IJ SOLN: 4.0000 mg | Freq: Four times a day (QID) | INTRAMUSCULAR | Status: DC | PRN

## 2016-03-07 MED ORDER — MIDAZOLAM HCL 2 MG/2ML IJ SOLN
INTRAMUSCULAR | Status: AC
  Filled 2016-03-07: qty 2

## 2016-03-07 MED ORDER — ALBUTEROL SULFATE (2.5 MG/3ML) 0.083% IN NEBU: 2.5000 mg | INHALATION_SOLUTION | Freq: Four times a day (QID) | RESPIRATORY_TRACT | Status: DC | PRN

## 2016-03-07 MED ORDER — FENTANYL CITRATE (PF) 250 MCG/5ML IJ SOLN
INTRAMUSCULAR | Status: AC
  Filled 2016-03-07: qty 10

## 2016-03-07 MED ORDER — THROMBIN 20000 UNITS EX SOLR
CUTANEOUS | Status: DC | PRN
  Administered 2016-03-07: 13:00:00 via TOPICAL

## 2016-03-07 MED ORDER — LIDOCAINE HCL (CARDIAC) 20 MG/ML IV SOLN
INTRAVENOUS | Status: DC | PRN
  Administered 2016-03-07: 100 mg via INTRAVENOUS

## 2016-03-07 MED ORDER — PHENOL 1.4 % MT LIQD: 1.0000 | OROMUCOSAL | Status: DC | PRN

## 2016-03-07 MED ORDER — BUPIVACAINE HCL (PF) 0.5 % IJ SOLN
INTRAMUSCULAR | Status: DC | PRN
  Administered 2016-03-07: 10 mL

## 2016-03-07 MED ORDER — MENTHOL 3 MG MT LOZG: 1.0000 | LOZENGE | OROMUCOSAL | Status: DC | PRN

## 2016-03-07 MED ORDER — SODIUM CHLORIDE 0.9 % IJ SOLN
INTRAMUSCULAR | Status: AC
  Filled 2016-03-07: qty 10

## 2016-03-07 MED ORDER — ESMOLOL HCL 100 MG/10ML IV SOLN
INTRAVENOUS | Status: AC
  Filled 2016-03-07: qty 10

## 2016-03-07 MED ORDER — HYDROCODONE-ACETAMINOPHEN 5-325 MG PO TABS
1.0000 | ORAL_TABLET | ORAL | Status: DC | PRN
  Administered 2016-03-07 – 2016-03-08 (×5): 2 via ORAL
  Filled 2016-03-07 (×5): qty 2

## 2016-03-07 MED ORDER — FENTANYL CITRATE (PF) 100 MCG/2ML IJ SOLN: 25.0000 ug | INTRAMUSCULAR | Status: DC | PRN

## 2016-03-07 MED ORDER — ZOLPIDEM TARTRATE 5 MG PO TABS: 5.0000 mg | ORAL_TABLET | Freq: Every evening | ORAL | Status: DC | PRN

## 2016-03-07 MED ORDER — MAGNESIUM HYDROXIDE 400 MG/5ML PO SUSP: 30.0000 mL | Freq: Every day | ORAL | Status: DC | PRN

## 2016-03-07 MED ORDER — BACLOFEN 10 MG PO TABS
10.0000 mg | ORAL_TABLET | Freq: Three times a day (TID) | ORAL | Status: DC | PRN
  Filled 2016-03-07: qty 1

## 2016-03-07 MED ORDER — 0.9 % SODIUM CHLORIDE (POUR BTL) OPTIME
TOPICAL | Status: DC | PRN
  Administered 2016-03-07 (×2): 1000 mL

## 2016-03-07 MED ORDER — TRIAMTERENE-HCTZ 75-50 MG PO TABS
1.0000 | ORAL_TABLET | Freq: Every day | ORAL | Status: DC
  Administered 2016-03-08: 1 via ORAL
  Filled 2016-03-07 (×2): qty 1

## 2016-03-07 MED ORDER — ALBUMIN HUMAN 5 % IV SOLN
INTRAVENOUS | Status: DC | PRN
  Administered 2016-03-07: 13:00:00 via INTRAVENOUS

## 2016-03-07 MED ORDER — KETOROLAC TROMETHAMINE 15 MG/ML IJ SOLN
INTRAMUSCULAR | Status: AC
  Administered 2016-03-07: 15 mg
  Filled 2016-03-07: qty 1

## 2016-03-07 MED ORDER — ONDANSETRON HCL 4 MG PO TABS: 4.0000 mg | ORAL_TABLET | Freq: Four times a day (QID) | ORAL | Status: DC | PRN

## 2016-03-07 MED ORDER — PHENYLEPHRINE 40 MCG/ML (10ML) SYRINGE FOR IV PUSH (FOR BLOOD PRESSURE SUPPORT)
PREFILLED_SYRINGE | INTRAVENOUS | Status: AC
  Filled 2016-03-07: qty 10

## 2016-03-07 MED ORDER — PROPOFOL 10 MG/ML IV BOLUS
INTRAVENOUS | Status: DC | PRN
  Administered 2016-03-07: 140 mg via INTRAVENOUS

## 2016-03-07 MED ORDER — ONDANSETRON HCL 4 MG/2ML IJ SOLN
INTRAMUSCULAR | Status: DC | PRN
  Administered 2016-03-07: 4 mg via INTRAVENOUS

## 2016-03-07 MED ORDER — SODIUM CHLORIDE 0.9 % IV SOLN: 250.0000 mL | INTRAVENOUS | Status: DC

## 2016-03-07 MED ORDER — PANTOPRAZOLE SODIUM 40 MG PO TBEC
80.0000 mg | DELAYED_RELEASE_TABLET | Freq: Every day | ORAL | Status: DC
  Administered 2016-03-08: 80 mg via ORAL
  Filled 2016-03-07: qty 2

## 2016-03-07 MED ORDER — LIDOCAINE HCL (CARDIAC) 20 MG/ML IV SOLN
INTRAVENOUS | Status: AC
  Filled 2016-03-07: qty 5

## 2016-03-07 MED ORDER — ONDANSETRON HCL 4 MG/2ML IJ SOLN
INTRAMUSCULAR | Status: AC
  Filled 2016-03-07: qty 2

## 2016-03-07 MED ORDER — DEXAMETHASONE SODIUM PHOSPHATE 10 MG/ML IJ SOLN
INTRAMUSCULAR | Status: DC | PRN
  Administered 2016-03-07: 5 mg via INTRAVENOUS

## 2016-03-07 MED ORDER — LIDOCAINE-EPINEPHRINE 1 %-1:100000 IJ SOLN
INTRAMUSCULAR | Status: DC | PRN
  Administered 2016-03-07: 10 mL

## 2016-03-07 MED ORDER — SUGAMMADEX SODIUM 200 MG/2ML IV SOLN
INTRAVENOUS | Status: AC
  Filled 2016-03-07: qty 2

## 2016-03-07 MED ORDER — CEFAZOLIN SODIUM 1 G IJ SOLR
INTRAMUSCULAR | Status: AC
  Filled 2016-03-07: qty 20

## 2016-03-07 MED ORDER — ROCURONIUM BROMIDE 100 MG/10ML IV SOLN
INTRAVENOUS | Status: DC | PRN
  Administered 2016-03-07: 10 mg via INTRAVENOUS
  Administered 2016-03-07: 40 mg via INTRAVENOUS
  Administered 2016-03-07: 10 mg via INTRAVENOUS

## 2016-03-07 MED ORDER — MIDAZOLAM HCL 5 MG/5ML IJ SOLN
INTRAMUSCULAR | Status: DC | PRN
  Administered 2016-03-07: 2 mg via INTRAVENOUS

## 2016-03-07 MED ORDER — ALUM & MAG HYDROXIDE-SIMETH 200-200-20 MG/5ML PO SUSP: 30.0000 mL | Freq: Four times a day (QID) | ORAL | Status: DC | PRN

## 2016-03-07 MED ORDER — ARTIFICIAL TEARS OP OINT
TOPICAL_OINTMENT | OPHTHALMIC | Status: DC | PRN
  Administered 2016-03-07: 1 via OPHTHALMIC

## 2016-03-07 MED ORDER — VANCOMYCIN HCL IN DEXTROSE 1-5 GM/200ML-% IV SOLN
INTRAVENOUS | Status: AC
  Administered 2016-03-07: 1000 mg via INTRAVENOUS
  Filled 2016-03-07: qty 200

## 2016-03-07 MED ORDER — SUGAMMADEX SODIUM 200 MG/2ML IV SOLN
INTRAVENOUS | Status: DC | PRN
  Administered 2016-03-07: 200 mg via INTRAVENOUS

## 2016-03-07 MED ORDER — PHENYLEPHRINE HCL 10 MG/ML IJ SOLN
10.0000 mg | INTRAMUSCULAR | Status: DC | PRN
  Administered 2016-03-07: 20 ug/min via INTRAVENOUS

## 2016-03-07 MED ORDER — KCL IN DEXTROSE-NACL 40-5-0.45 MEQ/L-%-% IV SOLN
INTRAVENOUS | Status: DC
Start: 2016-03-07 — End: 2016-03-08
  Filled 2016-03-07 (×5): qty 1000

## 2016-03-07 MED ORDER — SUCCINYLCHOLINE CHLORIDE 20 MG/ML IJ SOLN
INTRAMUSCULAR | Status: AC
  Filled 2016-03-07: qty 1

## 2016-03-07 MED ORDER — SODIUM CHLORIDE 0.9 % IR SOLN
Status: DC | PRN
  Administered 2016-03-07: 13:00:00

## 2016-03-07 MED ORDER — EPHEDRINE SULFATE 50 MG/ML IJ SOLN
INTRAMUSCULAR | Status: AC
  Filled 2016-03-07: qty 1

## 2016-03-07 MED ORDER — SODIUM CHLORIDE 0.9% FLUSH
3.0000 mL | Freq: Two times a day (BID) | INTRAVENOUS | Status: DC
  Administered 2016-03-07 – 2016-03-08 (×2): 3 mL via INTRAVENOUS

## 2016-03-07 MED ORDER — METOCLOPRAMIDE HCL 10 MG PO TABS: 10.0000 mg | ORAL_TABLET | Freq: Every day | ORAL | Status: DC

## 2016-03-07 MED ORDER — VITAMIN C 500 MG PO TABS
500.0000 mg | ORAL_TABLET | Freq: Every day | ORAL | Status: DC
  Administered 2016-03-08: 500 mg via ORAL
  Filled 2016-03-07 (×2): qty 1

## 2016-03-07 MED ORDER — AMLODIPINE BESYLATE 5 MG PO TABS
5.0000 mg | ORAL_TABLET | Freq: Every day | ORAL | Status: DC
  Administered 2016-03-08: 5 mg via ORAL
  Filled 2016-03-07: qty 1

## 2016-03-07 MED ORDER — GABAPENTIN 300 MG PO CAPS
300.0000 mg | ORAL_CAPSULE | Freq: Two times a day (BID) | ORAL | Status: DC
  Administered 2016-03-07 – 2016-03-08 (×2): 300 mg via ORAL
  Filled 2016-03-07 (×2): qty 1

## 2016-03-07 SURGICAL SUPPLY — 77 items
BAG DECANTER FOR FLEXI CONT (MISCELLANEOUS) ×2 IMPLANT
BLADE CLIPPER SURG (BLADE) IMPLANT
BRUSH SCRUB EZ PLAIN DRY (MISCELLANEOUS) ×2 IMPLANT
BUR ACRON 5.0MM COATED (BURR) ×2 IMPLANT
BUR MATCHSTICK NEURO 3.0 LAGG (BURR) ×2 IMPLANT
CANISTER SUCT 3000ML PPV (MISCELLANEOUS) ×2 IMPLANT
CAP LCK SPNE (Orthopedic Implant) ×1 IMPLANT
CAP LOCK SPINE RADIUS (Orthopedic Implant) ×1 IMPLANT
CAP LOCKING (Orthopedic Implant) ×1 IMPLANT
CONT SPEC 4OZ CLIKSEAL STRL BL (MISCELLANEOUS) ×2 IMPLANT
COVER BACK TABLE 60X90IN (DRAPES) ×2 IMPLANT
DERMABOND ADHESIVE PROPEN (GAUZE/BANDAGES/DRESSINGS) ×2
DERMABOND ADVANCED (GAUZE/BANDAGES/DRESSINGS)
DERMABOND ADVANCED .7 DNX12 (GAUZE/BANDAGES/DRESSINGS) IMPLANT
DERMABOND ADVANCED .7 DNX6 (GAUZE/BANDAGES/DRESSINGS) ×2 IMPLANT
DRAPE C-ARM 42X72 X-RAY (DRAPES) ×4 IMPLANT
DRAPE LAPAROTOMY 100X72X124 (DRAPES) ×2 IMPLANT
DRAPE POUCH INSTRU U-SHP 10X18 (DRAPES) ×2 IMPLANT
DRAPE PROXIMA HALF (DRAPES) ×4 IMPLANT
DRSG EMULSION OIL 3X3 NADH (GAUZE/BANDAGES/DRESSINGS) IMPLANT
ELECT REM PT RETURN 9FT ADLT (ELECTROSURGICAL) ×2
ELECTRODE REM PT RTRN 9FT ADLT (ELECTROSURGICAL) ×1 IMPLANT
GAUZE SPONGE 4X4 12PLY STRL (GAUZE/BANDAGES/DRESSINGS) ×2 IMPLANT
GAUZE SPONGE 4X4 16PLY XRAY LF (GAUZE/BANDAGES/DRESSINGS) IMPLANT
GLOVE BIOGEL PI IND STRL 7.0 (GLOVE) ×3 IMPLANT
GLOVE BIOGEL PI IND STRL 7.5 (GLOVE) ×2 IMPLANT
GLOVE BIOGEL PI IND STRL 8 (GLOVE) ×4 IMPLANT
GLOVE BIOGEL PI INDICATOR 7.0 (GLOVE) ×3
GLOVE BIOGEL PI INDICATOR 7.5 (GLOVE) ×2
GLOVE BIOGEL PI INDICATOR 8 (GLOVE) ×4
GLOVE ECLIPSE 7.5 STRL STRAW (GLOVE) ×4 IMPLANT
GLOVE EXAM NITRILE LRG STRL (GLOVE) IMPLANT
GLOVE EXAM NITRILE MD LF STRL (GLOVE) IMPLANT
GLOVE EXAM NITRILE XL STR (GLOVE) IMPLANT
GLOVE EXAM NITRILE XS STR PU (GLOVE) IMPLANT
GLOVE SS BIOGEL STRL SZ 7 (GLOVE) ×1 IMPLANT
GLOVE SUPERSENSE BIOGEL SZ 7 (GLOVE) ×1
GLOVE SURG SS PI 6.5 STRL IVOR (GLOVE) ×12 IMPLANT
GOWN STRL REUS W/ TWL LRG LVL3 (GOWN DISPOSABLE) ×5 IMPLANT
GOWN STRL REUS W/ TWL XL LVL3 (GOWN DISPOSABLE) ×1 IMPLANT
GOWN STRL REUS W/TWL 2XL LVL3 (GOWN DISPOSABLE) ×2 IMPLANT
GOWN STRL REUS W/TWL LRG LVL3 (GOWN DISPOSABLE) ×5
GOWN STRL REUS W/TWL XL LVL3 (GOWN DISPOSABLE) ×1
HEMOSTAT POWDER KIT SURGIFOAM (HEMOSTASIS) ×2 IMPLANT
KIT BASIN OR (CUSTOM PROCEDURE TRAY) ×2 IMPLANT
KIT INFUSE SMALL (Orthopedic Implant) ×2 IMPLANT
KIT ROOM TURNOVER OR (KITS) ×2 IMPLANT
MILL MEDIUM DISP (BLADE) ×2 IMPLANT
NEEDLE ASP BONE MRW 8GX15 (NEEDLE) ×2 IMPLANT
NEEDLE BONE MARROW 8GAX6 (NEEDLE) IMPLANT
NEEDLE SPNL 18GX3.5 QUINCKE PK (NEEDLE) ×2 IMPLANT
NEEDLE SPNL 22GX3.5 QUINCKE BK (NEEDLE) ×4 IMPLANT
NS IRRIG 1000ML POUR BTL (IV SOLUTION) ×4 IMPLANT
PACK LAMINECTOMY NEURO (CUSTOM PROCEDURE TRAY) ×2 IMPLANT
PAD ARMBOARD 7.5X6 YLW CONV (MISCELLANEOUS) ×6 IMPLANT
PATTIES SURGICAL .5 X.5 (GAUZE/BANDAGES/DRESSINGS) IMPLANT
PATTIES SURGICAL .5 X1 (DISPOSABLE) IMPLANT
PATTIES SURGICAL 1X1 (DISPOSABLE) ×2 IMPLANT
PEEK PLIF AVS 10X25X4 (Peek) ×4 IMPLANT
ROD 5.5X30MM (Rod) ×4 IMPLANT
SCREW 5.75X40M (Screw) ×8 IMPLANT
SPONGE LAP 4X18 X RAY DECT (DISPOSABLE) IMPLANT
SPONGE NEURO XRAY DETECT 1X3 (DISPOSABLE) IMPLANT
SPONGE SURGIFOAM ABS GEL 100 (HEMOSTASIS) ×2 IMPLANT
STRIP BIOACTIVE VITOSS 25X100X (Neuro Prosthesis/Implant) ×2 IMPLANT
STRIP BIOACTIVE VITOSS 25X52X4 (Orthopedic Implant) ×2 IMPLANT
SUT VIC AB 1 CT1 18XBRD ANBCTR (SUTURE) ×2 IMPLANT
SUT VIC AB 1 CT1 8-18 (SUTURE) ×2
SUT VIC AB 2-0 CP2 18 (SUTURE) ×4 IMPLANT
SYR 3ML LL SCALE MARK (SYRINGE) ×6 IMPLANT
SYR CONTROL 10ML LL (SYRINGE) ×2 IMPLANT
TAPE CLOTH SURG 4X10 WHT LF (GAUZE/BANDAGES/DRESSINGS) ×2 IMPLANT
TOWEL OR 17X24 6PK STRL BLUE (TOWEL DISPOSABLE) ×2 IMPLANT
TOWEL OR 17X26 10 PK STRL BLUE (TOWEL DISPOSABLE) ×2 IMPLANT
TRAY FOLEY W/METER SILVER 14FR (SET/KITS/TRAYS/PACK) ×2 IMPLANT
TUBE CONNECTING 12X1/4 (SUCTIONS) ×2 IMPLANT
WATER STERILE IRR 1000ML POUR (IV SOLUTION) ×2 IMPLANT

## 2016-03-07 NOTE — Op Note (Signed)
03/07/2016  2:53 PM  PATIENT:  Deanna Rose  70 y.o. female  PRE-OPERATIVE DIAGNOSIS:  L4-L5 lumbar stenosis with neurogenic claudication, dynamic degenerative grade 2 spondylolisthesis, lumbar spondylosis, lumbar degenerative disc disease  POST-OPERATIVE DIAGNOSIS:  L4-L5 lumbar stenosis with neurogenic claudication, dynamic degenerative grade 2 spondylolisthesis, lumbar spondylosis, lumbar degenerative disc disease  PROCEDURE:  Procedure(s):  Bilateral L4-5 lumbar laminectomy, facetectomy, and foraminotomies, for decompression of the stenotic compression of the exiting L4 and L5 nerve roots bilaterally, with decompression beyond that required for interbody arthrodesis; bilateral L4-L5 posterior lumbar interbody arthrodesis with AVS peek interbody implants, Vitoss BA with bone marrow aspirate, and infuse; bilateral L4-L5 posterior lateral arthrodesis with nonsegmental radius posterior instrumentation, Vitoss BA with bone marrow aspirate, and infuse  SURGEON:  Surgeon(s): Jovita Gamma, MD Tamala Fothergill, MD  ASSISTANTS: Daun Peacock, M.D.  ANESTHESIA:   general  EBL:  Total I/O In: 2550 [I.V.:2300; IV Piggyback:250] Out: 850 [Urine:500; Blood:350]  BLOOD ADMINISTERED:none  CELL SAVER GIVEN: Cell Saver technician felt that there was insufficient blood loss to process the collected blood  COUNT:  Correct per nursing staff   DICTATION: Patient is brought to the operating room placed under general endotracheal anesthesia. The patient was turned to prone position the lumbar region was prepped with Betadine soap and solution and draped in a sterile fashion. The midline was infiltrated with local anesthesia with epinephrine. A localizing x-ray was taken and then a midline incision was made carried down through the subcutaneous tissue, bipolar cautery and electrocautery were used to maintain hemostasis. Dissection was carried down to the lumbar fascia. The fascia was incised  bilaterally and the paraspinal muscles were dissected with a spinous process and lamina in a subperiosteal fashion. Another x-ray was taken for localization and the L4-5 level was localized. Dissection was then carried out laterally over the facet complex and the transverse processes of L4 and L5 were exposed and decorticated.  Bilateral L4 and L5 laminectomies were performed using the high-speed drill and Kerrison punches.  Dissection was carried out laterally including facetectomy and foraminotomies with decompression of the stenotic compression of the L4 and L5 nerve roots. Once the decompression stenotic compression of the thecal sac and exiting nerve roots was completed we proceeded with the posterior lumbar interbody arthrodesis. The annulus was incised bilaterally and the disc space entered. A thorough discectomy was performed using pituitary rongeurs and curettes. Once the discectomy was completed we began to prepare the endplate surfaces removing the cartilaginous endplates surface. We then measured the height of the intervertebral disc space. We selected 10 x 25 x 4 AVS peek interbody implants.  The C-arm fluoroscope was then draped and brought in the field and we identified the pedicle entry points bilaterally at the L4-L5 levels. Each of the 4 pedicles was probed, we aspirated bone marrow aspirate from the vertebral bodies, this was injected over a 10 cc strip and a 5 cc strip of Vitoss BA. Then each of the pedicles was examined with the ball probe good bony surfaces were found and no bony cuts were found. Each of the pedicles was then tapped with a 5.25 mm tap, again examined with the ball probe good threading was found and no bony cuts were found. We then placed 5.75 by 40  millimeter screws bilaterally at each level.  We then packed the AVS peek interbody implants with Vitoss BA with bone marrow aspirate and infuse, and then placed the first implant and on the right side, carefully retracting  the thecal sac and nerve root medially. We then went back to the left side and packed the midline with additional Vitoss BA  with bone marrow aspirate and infuse, and then placed a second implant and on the left side again retracting the thecal sac and nerve root medially. Additional Vitoss BA with bone marrow aspirate and infuse was packed lateral to the implants.  We then packed the lateral gutter over the transverse processes and intertransverse space with Vitoss BA with bone marrow aspirate and infuse. We then selected 30 mm pre-lordosed rods, they were placed within the screw heads and secured with locking caps once all 4 locking caps were placed final tightening was performed against a counter torque.  The wound had been irrigated multiple times during the procedure with saline solution and bacitracin solution, good hemostasis was established with a combination of bipolar cautery, Gelfoam with thrombin, and Surgifoam. Once good hemostasis was confirmed we proceeded with closure paraspinal muscles deep fascia and Scarpa's fascia were closed with interrupted undyed 1 Vicryl sutures the subcutaneous and subcuticular closed with interrupted inverted 2-0 undyed Vicryl sutures the skin edges were approximated with Dermabond.  The wound was dressed with sterile gauze and Hypafix.  Following surgery the patient was turned back to the supine position to be reversed and the anesthetic extubated and transferred to the recovery room for further care.   PLAN OF CARE: Admit for overnight observation  PATIENT DISPOSITION:  PACU - hemodynamically stable.   Delay start of Pharmacological VTE agent (>24hrs) due to surgical blood loss or risk of bleeding:  yes

## 2016-03-07 NOTE — Anesthesia Procedure Notes (Signed)
Procedure Name: Intubation Date/Time: 03/07/2016 10:46 AM Performed by: Jacquiline Doe A Pre-anesthesia Checklist: Patient identified, Timeout performed, Emergency Drugs available, Suction available and Patient being monitored Patient Re-evaluated:Patient Re-evaluated prior to inductionOxygen Delivery Method: Circle system utilized Preoxygenation: Pre-oxygenation with 100% oxygen Intubation Type: IV induction Ventilation: Mask ventilation without difficulty and Oral airway inserted - appropriate to patient size Laryngoscope Size: Mac and 4 Grade View: Grade I Tube type: Oral Tube size: 7.0 mm Number of attempts: 1 Airway Equipment and Method: Stylet Placement Confirmation: ETT inserted through vocal cords under direct vision,  breath sounds checked- equal and bilateral and positive ETCO2 Secured at: 21 cm Tube secured with: Tape Dental Injury: Teeth and Oropharynx as per pre-operative assessment

## 2016-03-07 NOTE — Progress Notes (Signed)
Filed Vitals:   03/07/16 1530 03/07/16 1545 03/07/16 1600 03/07/16 1615  BP: 143/75 133/68 143/72 145/59  Pulse: 102 103 103 109  Temp:   97.4 F (36.3 C) 97.9 F (36.6 C)  TempSrc:      Resp: 17 18 27 20   Height:      Weight:      SpO2: 100% 100% 100% 94%    Patient sitting up in bed eating dinner. Has walked the long length of the hall. Foley DC'd, nursing staff monitoring voiding function. Dressing clean and dry.  Plan: Continue to progress through postoperative recovery. Encouraged to ambulate in the halls.  Hosie Spangle, MD 03/07/2016, 7:01 PM

## 2016-03-07 NOTE — Transfer of Care (Signed)
Immediate Anesthesia Transfer of Care Note  Patient: Deanna Rose  Procedure(s) Performed: Procedure(s): Lumbar four-five decompression with posterior lumbar interbody fusion with interbody prosthesis posterior lateral arthrodesis and posterior nonsegmental instrumentation (N/A)  Patient Location: PACU  Anesthesia Type:General  Level of Consciousness: awake, oriented, sedated, patient cooperative and responds to stimulation  Airway & Oxygen Therapy: Patient Spontanous Breathing and Patient connected to nasal cannula oxygen  Post-op Assessment: Report given to RN, Post -op Vital signs reviewed and stable, Patient moving all extremities and Patient moving all extremities X 4  Post vital signs: Reviewed and stable  Last Vitals:  Filed Vitals:   03/07/16 0825  BP: 129/68  Pulse: 99  Temp: 36.8 C  Resp: 16    Complications: No apparent anesthesia complications

## 2016-03-07 NOTE — H&P (Signed)
Subjective: Patient is a 70 y.o. 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 We've been treating her with epidural steroid injections for over 3 years, but unfortunately become less effective for her. An updated MRI scan revealed multilevel degenerative changes in lumbar spine, but at the L4-L5 level there is the degenerative spinal listhesis secondary to advanced hypertrophic facet arthropathy, with broad-based disc protrusion, and marked ligament of flavum thickening, and resulting marked multifactorial canal stenosis and bilateral neural foraminal stenosis, left slightly worse than right. Symptomatically she continues to have pain in the low back radiating down to the buttocks thighs and legs bilaterally, somewhat worse on left than the right side, consistent with neurogenic claudication. She is admitted now for L4-5 lumbar decompression including laminectomy, facetectomy, foraminotomy, and lumbar stabilization via L4-5 posterior lumbar interbody arthrodesis with interbody implants and bone graft and posterior lateral arthrodesis with posterior instrumentation and bone graft.   Patient Active Problem List   Diagnosis Date Noted  . Low back pain   . Right flank pain   . Hypertension   . History of depression    Past Medical History  Diagnosis Date  . Low back pain     with degenerative disc disease by MRI  . Right flank pain     radicular  . Mild obesity   . History of sickle cell trait   . Hypertension     stable  . History of depression     without worsening  . Asthma   . Heart murmur   . Stroke [facility])     many years ago  1980s, left facial twitching takes botox   . Blood clot in vein     leg   1966  . GERD (gastroesophageal reflux disease)   . History of hiatal hernia   . Headache   . Arthritis   . Cancer [facility])     UTERUS   1970S    Past Surgical  History  Procedure Laterality Date  . Breast surgery      bil breast bx    2x each  . Bilateral carpal tunnel release    . Leg surgery      rt leg blood clot removed 1966  [facility]  . Uterine fibroid surgery      CANCER REMOVED    Prescriptions prior to admission  Medication Sig Dispense Refill Last Dose  . acetaminophen (TYLENOL) 500 MG tablet Take 1,000 mg by mouth every 8 (eight) hours as needed for mild pain or moderate pain.   03/07/2016 at 0700  . albuterol (PROVENTIL HFA;VENTOLIN HFA) 108 (90 Base) MCG/ACT inhaler Inhale 1 puff into the lungs every 6 (six) hours as needed for wheezing or shortness of breath.   03/07/2016 at 0700  . amiloride-hydrochlorothiazide (MODURETIC) 5-50 MG tablet Take 1 tablet by mouth daily.   03/06/2016  . amLODipine (NORVASC) 5 MG tablet Take 5 mg by mouth daily.   03/07/2016 at 0700  . b complex vitamins tablet Take 1 tablet by mouth daily.   Past Week at Unknown time  . baclofen (LIORESAL) 10 MG tablet Take 10 mg by mouth 3 (three) times daily as needed for muscle spasms.    Past Month at Unknown time  . Calcium Carb-Cholecalciferol (CALCIUM + D3) 600-200 MG-UNIT TABS Take 1 tablet by mouth daily.   Past Week at Unknown time  . cetirizine (ZYRTEC) 10 MG tablet Take  10 mg by mouth daily.   Past Month at Unknown time  . citalopram (CELEXA) 20 MG tablet Take 20 mg by mouth at bedtime.    03/06/2016 at Unknown time  . fluticasone (FLOVENT HFA) 110 MCG/ACT inhaler Inhale 1 puff into the lungs 2 (two) times daily as needed.   Past Month at Unknown time  . gabapentin (NEURONTIN) 300 MG capsule Take 300 mg by mouth 2 (two) times daily.    03/07/2016 at 0700  . ibuprofen (ADVIL,MOTRIN) 800 MG tablet Take 800 mg by mouth every 8 (eight) hours as needed for mild pain or moderate pain.    Past Month at Unknown time  . Krill Oil 1000 MG CAPS Take 1 capsule by mouth daily.   Past Month at Unknown time  . Multiple Vitamins-Minerals (CENTRUM SILVER PO) Take 1 tablet by mouth  daily.    Past Month at Unknown time  . omeprazole (PRILOSEC) 40 MG capsule Take 40 mg by mouth daily.   03/07/2016 at 0700  . vitamin C (ASCORBIC ACID) 500 MG tablet Take 500 mg by mouth daily.   Past Week at Unknown time  . vitamin E 400 UNIT capsule Take 400 Units by mouth daily.   Past Week at Unknown time  . metoCLOPramide (REGLAN) 10 MG tablet Take 10 mg by mouth daily.   More than a month at Unknown time   Allergies  Allergen Reactions  . Sulfa Antibiotics Hives    Other reaction(s): RASH    Social History  Substance Use Topics  . Smoking status: Never Smoker   . Smokeless tobacco: Not on file  . Alcohol Use: No    History reviewed. No pertinent family history.   Review of Systems A comprehensive review of systems was negative.  Objective: Vital signs in last 24 hours: Temp:  [98.2 F (36.8 C)] 98.2 F (36.8 C) (04/20 0825) Pulse Rate:  [99] 99 (04/20 0825) Resp:  [16] 16 (04/20 0825) BP: (129)/(68) 129/68 mmHg (04/20 0825) SpO2:  [98 %] 98 % (04/20 0825) Weight:  [74.39 kg (164 lb)] 74.39 kg (164 lb) (04/20 0825)  EXAM:  patient well-developed well-nourished black female in no acute distress. Lungs are clear to auscultation , the patient has symmetrical respiratory excursion. Heart has a regular rate and rhythm normal S1 and S2 no murmur.   Abdomen is soft nontender nondistended bowel sounds are present. Extremity examination shows no clubbing cyanosis or edema. Motor examination shows 5 over 5 strength in the lower extremities including the iliopsoas quadriceps dorsiflexor extensor hallicus  longus and plantar flexor bilaterally. Sensation is intact to pinprick in the distal lower extremities. Reflexes are symmetrical bilaterally. No pathologic reflexes are present. Patient has a normal gait and stance.   Data Review:CBC    Component Value Date/Time   WBC 10.4 02/28/2016 1333   RBC 4.31 02/28/2016 1333   HGB 12.3 02/28/2016 1333   HCT 36.6 02/28/2016 1333   PLT 397  02/28/2016 1333   MCV 84.9 02/28/2016 1333   MCH 28.5 02/28/2016 1333   MCHC 33.6 02/28/2016 1333   RDW 14.0 02/28/2016 1333   LYMPHSABS 0.7 05/25/2009 1420   MONOABS 0.6 05/25/2009 1420   EOSABS 0.0 05/25/2009 1420   BASOSABS 0.0 05/25/2009 1420                          BMET    Component Value Date/Time   NA 140 02/28/2016 1333   K 3.6  02/28/2016 1333   CL 101 02/28/2016 1333   CO2 27 02/28/2016 1333   GLUCOSE 105* 02/28/2016 1333   BUN 16 02/28/2016 1333   CREATININE 0.84 02/28/2016 1333   CALCIUM 10.5* 02/28/2016 1333   GFRNONAA >60 02/28/2016 1333   GFRAA >60 02/28/2016 1333     Assessment/Plan: Patient with L4-5 lumbar stenosis, with neurogenic claudication, and a grade 2 dynamic degenerative spondylolisthesis who is admitted for L4-5 decompression and arthrodesis.  I've discussed with the patient the nature of his condition, the nature the surgical procedure, the typical length of surgery, hospital stay, and overall recuperation, the limitations postoperatively, and risks of surgery. I discussed risks including risks of infection, bleeding, possibly need for transfusion, the risk of nerve root dysfunction with pain, weakness, numbness, or paresthesias, the risk of dural tear and CSF leakage and possible need for further surgery, the risk of failure of the arthrodesis and possibly for further surgery, the risk of anesthetic complications including myocardial infarction, stroke, pneumonia, and death. We discussed the need for postoperative immobilization in a lumbar brace. Understanding all this the patient does wish to proceed with surgery and is admitted for such.     Hosie Spangle, MD 03/07/2016 10:30 AM

## 2016-03-07 NOTE — Anesthesia Postprocedure Evaluation (Signed)
Anesthesia Post Note  Patient: Deanna Rose  Procedure(s) Performed: Procedure(s) (LRB): Lumbar four-five decompression with posterior lumbar interbody fusion with interbody prosthesis posterior lateral arthrodesis and posterior nonsegmental instrumentation (N/A)  Patient location during evaluation: PACU Anesthesia Type: General Level of consciousness: awake and alert Pain management: pain level controlled Vital Signs Assessment: post-procedure vital signs reviewed and stable Respiratory status: spontaneous breathing, nonlabored ventilation, respiratory function stable and patient connected to nasal cannula oxygen Cardiovascular status: blood pressure returned to baseline and stable Postop Assessment: no signs of nausea or vomiting Anesthetic complications: no    Last Vitals:  Filed Vitals:   03/07/16 0825  BP: 129/68  Pulse: 99  Temp: 36.8 C  Resp: 16    Last Pain:  Filed Vitals:   03/07/16 0826  PainSc: [place]

## 2016-03-08 DIAGNOSIS — M4806 Spinal stenosis, lumbar region: Secondary | ICD-10-CM | POA: Diagnosis not present

## 2016-03-08 MED ORDER — HYDROCODONE-ACETAMINOPHEN 5-325 MG PO TABS: 1.0000 | ORAL_TABLET | ORAL | Status: DC | PRN

## 2016-03-08 MED FILL — Sodium Chloride IV Soln 0.9%: INTRAVENOUS | Qty: 1000 | Status: AC

## 2016-03-08 MED FILL — Heparin Sodium (Porcine) Inj 1000 Unit/ML: INTRAMUSCULAR | Qty: 30 | Status: AC

## 2016-03-08 NOTE — Progress Notes (Signed)
Pt doing well. Pt and son given D/C instructions with Rx, verbal understanding was provided. Pt's incision is open to air and has no sign of infection. Pt's IV was removed prior to D/C. Pt D/C'd home via wheelchair @ 1630 per MD order. Pt is stable @ D/C and has no other needs at this time. Holli Humbles, RN

## 2016-03-08 NOTE — Discharge Summary (Signed)
Physician Discharge Summary  Patient ID: Deanna Rose MRN: [id number] DOB/AGE: 1946/04/08 70 y.o.  Admit date: 03/07/2016 Discharge date: 03/08/2016  Admission Diagnoses:  L4-L5 lumbar stenosis with neurogenic claudication, dynamic degenerative grade 2 spondylolisthesis, lumbar spondylosis, lumbar degenerative disc disease  Discharge Diagnoses:  L4-L5 lumbar stenosis with neurogenic claudication, dynamic degenerative grade 2 spondylolisthesis, lumbar spondylosis, lumbar degenerative disc disease Active Problems:   Lumbar stenosis with neurogenic claudication   Discharged Condition: good  Hospital Course: Patient admitted, underwent a bilateral L4-5 lumbar decompression and stabilization. Patient has made excellent progress following surgery. She is up and ambulating actively. Patient is voiding well. Patient is being discharged to home with instructions regarding wound care and activities. She is to follow-up with me in the office in 3 weeks.  Discharge Exam: Blood pressure 114/64, pulse 90, temperature 98.2 F (36.8 C), temperature source Oral, resp. rate 18, height 5\' 2"  (1.575 m), weight 74.39 kg (164 lb), SpO2 97 %.  Disposition:  Home    Medication List    TAKE these medications        acetaminophen 500 MG tablet  Commonly known as:  TYLENOL  Take 1,000 mg by mouth every 8 (eight) hours as needed for mild pain or moderate pain.     albuterol 108 (90 Base) MCG/ACT inhaler  Commonly known as:  PROVENTIL HFA;VENTOLIN HFA  Inhale 1 puff into the lungs every 6 (six) hours as needed for wheezing or shortness of breath.     amiloride-hydrochlorothiazide 5-50 MG tablet  Commonly known as:  MODURETIC  Take 1 tablet by mouth daily.     amLODipine 5 MG tablet  Commonly known as:  NORVASC  Take 5 mg by mouth daily.     b complex vitamins tablet  Take 1 tablet by mouth daily.     baclofen 10 MG tablet  Commonly known as:  LIORESAL  Take 10 mg by mouth 3 (three) times  daily as needed for muscle spasms.     Calcium + D3 600-200 MG-UNIT Tabs  Take 1 tablet by mouth daily.     CENTRUM SILVER PO  Take 1 tablet by mouth daily.     cetirizine 10 MG tablet  Commonly known as:  ZYRTEC  Take 10 mg by mouth daily.     citalopram 20 MG tablet  Commonly known as:  CELEXA  Take 20 mg by mouth at bedtime.     fluticasone 110 MCG/ACT inhaler  Commonly known as:  FLOVENT HFA  Inhale 1 puff into the lungs 2 (two) times daily as needed.     gabapentin 300 MG capsule  Commonly known as:  NEURONTIN  Take 300 mg by mouth 2 (two) times daily.     HYDROcodone-acetaminophen 5-325 MG tablet  Commonly known as:  NORCO/VICODIN  Take 1 tablet by mouth every 4 (four) hours as needed (pain).     ibuprofen 800 MG tablet  Commonly known as:  ADVIL,MOTRIN  Take 800 mg by mouth every 8 (eight) hours as needed for mild pain or moderate pain.     Krill Oil 1000 MG Caps  Take 1 capsule by mouth daily.     metoCLOPramide 10 MG tablet  Commonly known as:  REGLAN  Take 10 mg by mouth daily.     omeprazole 40 MG capsule  Commonly known as:  PRILOSEC  Take 40 mg by mouth daily.     vitamin C 500 MG tablet  Commonly known as:  ASCORBIC ACID  Take  500 mg by mouth daily.     vitamin E 400 UNIT capsule  Take 400 Units by mouth daily.         SignedHosie Spangle 03/08/2016, 7:32 AM

## 2016-03-08 NOTE — Discharge Instructions (Signed)
Wound Care °Leave incision open to air. °You may shower. °Do not scrub directly on incision.  °Do not put any creams, lotions, or ointments on incision. °Activity °Walk each and every day, increasing distance each day. °No lifting greater than 5 lbs.  Avoid bending, arching, and twisting. °No driving for 2 weeks; may ride as a passenger locally. °If provided with back brace, wear when out of bed.  It is not necessary to wear in bed. °Diet °Resume your normal diet.  °Return to Work °Will be discussed at you follow up appointment. °Call Your Doctor If Any of These Occur °Redness, drainage, or swelling at the wound.  °Temperature greater than 101 degrees. °Severe pain not relieved by pain medication. °Incision starts to come apart. °Follow Up Appt °Call today for appointment in 3 weeks ([phone]) or for problems.  If you have any hardware placed in your spine, you will need an x-ray before your appointment. ° ° °Spinal Fusion °Spinal fusion is a procedure to make 2 or more of the bones in your spinal column (vertebrae) grow together (fuse). This procedure stops movement between the vertebrae and can relieve pain and prevent deformity.  °Spinal fusion is used to treat the following conditions: °· Fractures of the spine. °· Herniated disk (the spongy material [cartilage] between the vertebrae). °· Abnormal curvatures of the spine, such as scoliosis or kyphosis. °· A weak or an unstable spine, caused by infections or tumor. °RISKS AND COMPLICATIONS °Complications associated with spinal fusion are rare, but they can occur. Possible complications include: °· Bleeding. °· Infection near the incision. °· Nerve damage. Signs of nerve damage are back pain, pain in one or both legs, weakness, or numbness. °· Spinal fluid leakage. °· Blood clot in your leg, which can move to your lungs. °· Difficulty controlling urination or bowel movements. °BEFORE THE PROCEDURE °· A medical evaluation will be done. This will include a physical  exam, blood tests, and imaging exams. °· You will talk with an anesthesiologist. This is the person who will be in charge of the anesthesia during the procedure. Spinal fusion usually requires that you are asleep during the procedure (general anesthesia). °· You will need to stop taking certain medicines, particularly those associated with an increased risk of bleeding. Ask your caregiver about changing or stopping your regular medicines. °· If you smoke, you will need to stop at least 2 weeks before the procedure. Smoking can slow down the healing process, especially fusion of the vertebrae, and increase the risk of complications. °· Do not eat or drink anything for at least 8 hours before the procedure. °PROCEDURE  °A cut (incision) is made over the vertebrae that will be fused. The back muscles are separated from the vertebrae. If you are having this procedure to treat a herniated disk, the disc material pressing on the nerve root is removed (decompression). The area where the disk is removed is then filled with extra bone. Bone from another part of your body (autogenous bone) or bone from a bone donor (allograft bone) may be used. The extra bone promotes fusion between the vertebrae. Sometimes, specific medicines are added to the fusion area to promote bone healing. In most cases, screws and rods or metal plates will be used to attach the vertebrae to stabilize them while they fuse.  °AFTER THE PROCEDURE  °· You will stay in a recovery area until the anesthesia has worn off. Your blood pressure and pulse will be checked frequently. °· You will be   given antibiotics to prevent infection. °· You may continue to receive fluids through an intravenous (IV) tube while you are still in the hospital. °· Pain after surgery is normal. You will be given pain medicine. °· You will be taught how to move correctly and how to stand and walk. While in bed, you will be instructed to turn frequently, using a "log rolling"  technique, in which the entire body is moved without twisting the back. °  °This information is not intended to replace advice given to you by your health care provider. Make sure you discuss any questions you have with your health care provider. °  °Document Released: 08/03/2003 Document Revised: 01/27/2012 Document Reviewed: 04/19/2015 °Elsevier Interactive Patient Education ©2016 [employer] ° °

## 2016-03-13 LAB — TYPE AND SCREEN
ABO/RH(D): AB POS
Antibody Screen: POSITIVE
DAT, IgG: NEGATIVE
Donor AG Type: NEGATIVE
Donor AG Type: NEGATIVE
PT AG Type: NEGATIVE
Unit division: 0
Unit division: 0

## 2016-12-30 ENCOUNTER — Other Ambulatory Visit: Payer: Self-pay | Admitting: Internal Medicine

## 2016-12-30 DIAGNOSIS — Z1231 Encounter for screening mammogram for malignant neoplasm of breast: Secondary | ICD-10-CM

## 2017-01-21 ENCOUNTER — Ambulatory Visit
Admission: RE | Admit: 2017-01-21 | Discharge: 2017-01-21 | Disposition: A | Discharge status: Home / Self Care | Payer: Medicare Other | Source: Ambulatory Visit | Attending: Internal Medicine | Admitting: Internal Medicine

## 2017-01-21 DIAGNOSIS — Z1231 Encounter for screening mammogram for malignant neoplasm of breast: Secondary | ICD-10-CM

## 2018-01-12 ENCOUNTER — Other Ambulatory Visit: Payer: Self-pay | Admitting: Internal Medicine

## 2018-01-12 DIAGNOSIS — Z1231 Encounter for screening mammogram for malignant neoplasm of breast: Secondary | ICD-10-CM

## 2018-02-02 ENCOUNTER — Ambulatory Visit
Admission: RE | Admit: 2018-02-02 | Discharge: 2018-02-02 | Disposition: A | Discharge status: Home / Self Care | Payer: Medicare Other | Source: Ambulatory Visit | Attending: Internal Medicine | Admitting: Internal Medicine

## 2018-02-02 DIAGNOSIS — Z1231 Encounter for screening mammogram for malignant neoplasm of breast: Secondary | ICD-10-CM

## 2019-01-20 ENCOUNTER — Other Ambulatory Visit: Payer: Self-pay | Admitting: Internal Medicine

## 2019-01-20 DIAGNOSIS — Z1231 Encounter for screening mammogram for malignant neoplasm of breast: Secondary | ICD-10-CM

## 2019-02-18 ENCOUNTER — Ambulatory Visit: Payer: Medicare Other

## 2019-04-09 ENCOUNTER — Other Ambulatory Visit: Payer: Self-pay

## 2019-04-09 ENCOUNTER — Ambulatory Visit
Admission: RE | Admit: 2019-04-09 | Discharge: 2019-04-09 | Disposition: A | Discharge status: Home / Self Care | Payer: Medicare Other | Source: Ambulatory Visit | Attending: Internal Medicine | Admitting: Internal Medicine

## 2019-04-09 DIAGNOSIS — Z1231 Encounter for screening mammogram for malignant neoplasm of breast: Secondary | ICD-10-CM

## 2021-01-24 ENCOUNTER — Ambulatory Visit
Admission: RE | Admit: 2021-01-24 | Discharge: 2021-01-24 | Disposition: A | Discharge status: Home / Self Care | Payer: Medicare Other | Source: Ambulatory Visit | Attending: Internal Medicine | Admitting: Internal Medicine

## 2021-01-24 ENCOUNTER — Other Ambulatory Visit: Payer: Self-pay | Admitting: Internal Medicine

## 2021-01-24 DIAGNOSIS — J329 Chronic sinusitis, unspecified: Secondary | ICD-10-CM

## 2021-04-21 ENCOUNTER — Ambulatory Visit
Admission: EM | Admit: 2021-04-21 | Discharge: 2021-04-21 | Disposition: A | Discharge status: Home / Self Care | Payer: Medicare PPO | Attending: Urgent Care | Admitting: Urgent Care

## 2021-04-21 ENCOUNTER — Encounter: Payer: Self-pay | Admitting: Emergency Medicine

## 2021-04-21 ENCOUNTER — Other Ambulatory Visit: Payer: Self-pay

## 2021-04-21 DIAGNOSIS — R0602 Shortness of breath: Secondary | ICD-10-CM

## 2021-04-21 DIAGNOSIS — R059 Cough, unspecified: Secondary | ICD-10-CM

## 2021-04-21 DIAGNOSIS — B349 Viral infection, unspecified: Secondary | ICD-10-CM

## 2021-04-21 DIAGNOSIS — Z20822 Contact with and (suspected) exposure to covid-19: Secondary | ICD-10-CM

## 2021-04-21 DIAGNOSIS — Z1152 Encounter for screening for COVID-19: Secondary | ICD-10-CM

## 2021-04-21 DIAGNOSIS — J4540 Moderate persistent asthma, uncomplicated: Secondary | ICD-10-CM

## 2021-04-21 MED ORDER — PROMETHAZINE-DM 6.25-15 MG/5ML PO SYRP: 5.0000 mL | ORAL_SOLUTION | Freq: Every evening | ORAL | 0 refills | Status: DC | PRN

## 2021-04-21 MED ORDER — PREDNISONE 20 MG PO TABS: ORAL_TABLET | ORAL | 0 refills | Status: DC

## 2021-04-21 MED ORDER — CETIRIZINE HCL 5 MG PO TABS: 5.0000 mg | ORAL_TABLET | Freq: Every day | ORAL | 0 refills | Status: DC

## 2021-04-21 NOTE — Discharge Instructions (Signed)

## 2021-04-21 NOTE — ED Provider Notes (Signed)
[facility]   MRN: [id number] DOB: 13-Sep-1946  Subjective:   JORDAIN RADIN is a 75 y.o. female presenting for 2-day history of fatigue, sinus congestion, malaise, cough.  She did have exposure to her son who tested positive for COVID-19 this past week.  Has COVID vaccination and booster.  Denies active chest pain, shortness of breath.  No fevers.  She does have a history of asthma and is using her albuterol and Flovent.  No current facility-administered medications for this encounter.  Current Outpatient Medications:  .  acetaminophen (TYLENOL) 500 MG tablet, Take 1,000 mg by mouth every 8 (eight) hours as needed for mild pain or moderate pain., Disp: , Rfl:  .  albuterol (PROVENTIL HFA;VENTOLIN HFA) 108 (90 Base) MCG/ACT inhaler, Inhale 1 puff into the lungs every 6 (six) hours as needed for wheezing or shortness of breath., Disp: , Rfl:  .  amiloride-hydrochlorothiazide (MODURETIC) 5-50 MG tablet, Take 1 tablet by mouth daily., Disp: , Rfl:  .  amLODipine (NORVASC) 5 MG tablet, Take 5 mg by mouth daily., Disp: , Rfl:  .  b complex vitamins tablet, Take 1 tablet by mouth daily., Disp: , Rfl:  .  baclofen (LIORESAL) 10 MG tablet, Take 10 mg by mouth 3 (three) times daily as needed for muscle spasms. , Disp: , Rfl:  .  Calcium Carb-Cholecalciferol (CALCIUM + D3) 600-200 MG-UNIT TABS, Take 1 tablet by mouth daily., Disp: , Rfl:  .  cetirizine (ZYRTEC) 10 MG tablet, Take 10 mg by mouth daily., Disp: , Rfl:  .  citalopram (CELEXA) 20 MG tablet, Take 20 mg by mouth at bedtime. , Disp: , Rfl:  .  fluticasone (FLOVENT HFA) 110 MCG/ACT inhaler, Inhale 1 puff into the lungs 2 (two) times daily as needed., Disp: , Rfl:  .  gabapentin (NEURONTIN) 300 MG capsule, Take 300 mg by mouth 2 (two) times daily. , Disp: , Rfl:  .  HYDROcodone-acetaminophen (NORCO/VICODIN) 5-325 MG tablet, Take 1 tablet by mouth every 4 (four) hours as needed (pain)., Disp: 75 tablet, Rfl: 0 .  ibuprofen  (ADVIL,MOTRIN) 800 MG tablet, Take 800 mg by mouth every 8 (eight) hours as needed for mild pain or moderate pain. , Disp: , Rfl:  .  Krill Oil 1000 MG CAPS, Take 1 capsule by mouth daily., Disp: , Rfl:  .  metoCLOPramide (REGLAN) 10 MG tablet, Take 10 mg by mouth daily., Disp: , Rfl:  .  Multiple Vitamins-Minerals (CENTRUM SILVER PO), Take 1 tablet by mouth daily. , Disp: , Rfl:  .  omeprazole (PRILOSEC) 40 MG capsule, Take 40 mg by mouth daily., Disp: , Rfl:  .  vitamin C (ASCORBIC ACID) 500 MG tablet, Take 500 mg by mouth daily., Disp: , Rfl:  .  vitamin E 400 UNIT capsule, Take 400 Units by mouth daily., Disp: , Rfl:    Allergies  Allergen Reactions  . Sulfa Antibiotics Hives    Other reaction(s): RASH    Past Medical History:  Diagnosis Date  . Arthritis   . Asthma   . Blood clot in vein    leg   1966  . Cancer ([facility])    UTERUS   1970S  . GERD (gastroesophageal reflux disease)   . Headache   . Heart murmur   . History of depression    without worsening  . History of hiatal hernia   . History of sickle cell trait   . Hypertension    stable  . Low back pain  with degenerative disc disease by MRI  . Mild obesity   . Right flank pain    radicular  . Stroke [facility])    many years ago  1980s, left facial twitching takes botox      Past Surgical History:  Procedure Laterality Date  . BILATERAL CARPAL TUNNEL RELEASE    . BREAST EXCISIONAL BIOPSY Left 1970  . BREAST SURGERY     bil breast bx    2x each  . LEG SURGERY     rt leg blood clot removed 1966  [facility]  . UTERINE FIBROID SURGERY     CANCER REMOVED    History reviewed. No pertinent family history.  Social History   Tobacco Use  . Smoking status: Never Smoker  Substance Use Topics  . Alcohol use: No  . Drug use: No    ROS   Objective:   Vitals: BP 126/84 (BP Location: Right Arm)   Pulse 84   Temp 98.6 F (37 C) (Oral)   Resp 16   SpO2 96%   Physical Exam Constitutional:      General: She is  not in acute distress.    Appearance: Normal appearance. She is well-developed. She is not ill-appearing, toxic-appearing or diaphoretic.  HENT:     Head: Normocephalic and atraumatic.     Nose: Nose normal.     Mouth/Throat:     Mouth: Mucous membranes are moist.  Eyes:     Extraocular Movements: Extraocular movements intact.     Pupils: Pupils are equal, round, and reactive to light.  Cardiovascular:     Rate and Rhythm: Normal rate and regular rhythm.     Pulses: Normal pulses.     Heart sounds: Normal heart sounds. No murmur heard. No friction rub. No gallop.   Pulmonary:     Effort: Pulmonary effort is normal. No respiratory distress.     Breath sounds: Normal breath sounds. No stridor. No wheezing, rhonchi or rales.  Skin:    General: Skin is warm and dry.     Findings: No rash.  Neurological:     Mental Status: She is alert and oriented to person, place, and time.  Psychiatric:        Mood and Affect: Mood normal.        Behavior: Behavior normal.        Thought Content: Thought content normal.      Assessment and Plan :   PDMP not reviewed this encounter.  1. Viral illness   2. Encounter for screening for COVID-19   3. Close exposure to COVID-19 virus   4. Cough   5. Shortness of breath   6. Moderate persistent asthma without complication     Will manage for viral illness such as viral URI, viral syndrome, viral rhinitis, COVID-19. Counseled patient on nature of COVID-19 including modes of transmission, diagnostic testing, management and supportive care.  Offered scripts for symptomatic relief. COVID 19 testing is pending. Counseled patient on potential for adverse effects with medications prescribed/recommended today, ER and return-to-clinic precautions discussed, patient verbalized understanding.     Jaynee Eagles, PA-C 04/21/21 1453

## 2021-04-21 NOTE — ED Triage Notes (Signed)
Pt was with son last weekend and he was feeling bad on Thursday and positive for Covid. She began to have fatigue, sinus and a slight cough last night. X 2 days .No fevers, no chills.

## 2021-04-22 LAB — NOVEL CORONAVIRUS, NAA: SARS-CoV-2, NAA: NOT DETECTED

## 2021-04-22 LAB — SARS-COV-2, NAA 2 DAY TAT

## 2021-05-16 ENCOUNTER — Other Ambulatory Visit: Payer: Self-pay | Admitting: Internal Medicine

## 2021-05-16 ENCOUNTER — Ambulatory Visit
Admission: RE | Admit: 2021-05-16 | Discharge: 2021-05-16 | Disposition: A | Discharge status: Home / Self Care | Payer: Medicare PPO | Source: Ambulatory Visit | Attending: Internal Medicine | Admitting: Internal Medicine

## 2021-05-16 DIAGNOSIS — J9819 Other pulmonary collapse: Secondary | ICD-10-CM

## 2021-05-16 DIAGNOSIS — R059 Cough, unspecified: Secondary | ICD-10-CM

## 2021-05-30 ENCOUNTER — Other Ambulatory Visit: Payer: Self-pay | Admitting: Internal Medicine

## 2021-06-01 ENCOUNTER — Other Ambulatory Visit: Payer: Self-pay | Admitting: Internal Medicine

## 2021-06-01 DIAGNOSIS — R0989 Other specified symptoms and signs involving the circulatory and respiratory systems: Secondary | ICD-10-CM

## 2021-06-04 ENCOUNTER — Other Ambulatory Visit: Payer: Self-pay | Admitting: Internal Medicine

## 2021-06-04 ENCOUNTER — Ambulatory Visit
Admission: RE | Admit: 2021-06-04 | Discharge: 2021-06-04 | Disposition: A | Discharge status: Home / Self Care | Payer: Medicare PPO | Source: Ambulatory Visit | Attending: Internal Medicine | Admitting: Internal Medicine

## 2021-06-04 DIAGNOSIS — R109 Unspecified abdominal pain: Secondary | ICD-10-CM

## 2021-06-22 ENCOUNTER — Ambulatory Visit
Admission: RE | Admit: 2021-06-22 | Discharge: 2021-06-22 | Disposition: A | Discharge status: Home / Self Care | Payer: Medicare PPO | Source: Ambulatory Visit | Attending: Internal Medicine | Admitting: Internal Medicine

## 2021-06-22 ENCOUNTER — Other Ambulatory Visit: Payer: Self-pay

## 2021-06-22 DIAGNOSIS — R0989 Other specified symptoms and signs involving the circulatory and respiratory systems: Secondary | ICD-10-CM

## 2021-06-22 MED ORDER — IOPAMIDOL (ISOVUE-300) INJECTION 61%
75.0000 mL | Freq: Once | INTRAVENOUS | Status: AC | PRN
  Administered 2021-06-22: 75 mL via INTRAVENOUS

## 2021-07-17 ENCOUNTER — Encounter: Payer: Self-pay | Admitting: Cardiology

## 2021-07-17 ENCOUNTER — Ambulatory Visit: Payer: Medicare PPO | Admitting: Cardiology

## 2021-07-17 ENCOUNTER — Other Ambulatory Visit: Payer: Self-pay

## 2021-07-17 VITALS — BP 138/82 | HR 103 | Temp 98.7°F | Resp 16 | Ht 62.0 in | Wt 146.0 lb

## 2021-07-17 DIAGNOSIS — I2510 Atherosclerotic heart disease of native coronary artery without angina pectoris: Secondary | ICD-10-CM

## 2021-07-17 DIAGNOSIS — R0609 Other forms of dyspnea: Secondary | ICD-10-CM

## 2021-07-17 DIAGNOSIS — E7800 Pure hypercholesterolemia, unspecified: Secondary | ICD-10-CM

## 2021-07-17 DIAGNOSIS — I10 Essential (primary) hypertension: Secondary | ICD-10-CM

## 2021-07-17 DIAGNOSIS — I700 Atherosclerosis of aorta: Secondary | ICD-10-CM

## 2021-07-17 DIAGNOSIS — R0600 Dyspnea, unspecified: Secondary | ICD-10-CM

## 2021-07-17 NOTE — Progress Notes (Signed)
Primary Physician/Referring:  Rogers Blocker, MD  Patient ID: Deanna Rose, female    DOB: October 14, 1946, 75 y.o.   MRN: [id number]  Chief Complaint  Patient presents with   Exertioal Dyspnea   New Patient (Initial Visit)    Referred by Kevan Ny, MD   HPI:    Deanna Rose  is a 75 y.o. African-American female patient with hypertension, mild intermittent asthma, chronically elevated right hemidiaphragm with scarring in the right middle and lower lobe lung, stage IIIa chronic kidney disease, vitamin D deficiency is referred to me for evaluation of dyspnea on exertion and marked coronary calcification noted in the LAD and aortic atherosclerosis by recent CT scan of the chest.  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  Today fortunately there is no clinical evidence of heart failure, I will obtain a proBNP for baseline evaluation.  She is accompanied by her daughter-in-law.  Denies palpitations or chest pain.  Past Medical History:  Diagnosis Date   Arthritis    Asthma    Blood clot in vein    leg   1966   Cancer ([place])    UTERUS   1970S   GERD (gastroesophageal reflux disease)    Headache    Heart murmur    History of depression    without worsening   History of hiatal hernia    History of sickle cell trait    Hypertension    stable   Low back pain    with degenerative disc disease by MRI   Mild obesity    Right flank pain    radicular   Stroke ([place])    many years ago  1980s, left facial twitching takes botox    Past Surgical History:  Procedure Laterality Date   BILATERAL CARPAL TUNNEL RELEASE     BREAST EXCISIONAL BIOPSY Left 1970   BREAST SURGERY     bil breast bx    2x each   LEG SURGERY     rt leg blood clot removed [place]     CANCER REMOVED   Family History  Problem Relation Age of Onset   Heart disease Mother     Hypertension Mother    Heart disease Father    Hypertension Father    Hypertension Sister    Heart disease Sister     Social History   Tobacco Use   Smoking status: Former    Packs/day: 0.25    Years: 12.00    Pack years: 3.00    Types: Cigarettes    Quit date: 1970    Years since quitting: 52.6   Smokeless tobacco: Never  Substance Use Topics   Alcohol use: No   Marital Status: Divorced  ROS  Review of Systems  Cardiovascular:  Positive for dyspnea on exertion. Negative for chest pain and leg swelling.  Respiratory:  Positive for cough (while eating).   Gastrointestinal:  Positive for constipation. Negative for melena.  Objective  Blood pressure 138/82, pulse (!) 103, temperature 98.7 F (37.1 C), resp. rate 16, height _0  (1.575 m), weight 146 lb (66.2 kg), SpO2 98 %. Body mass index is 26.7 kg/m.  Vitals with BMI 07/17/2021 04/21/2021 03/08/2016  Height _1  - -  Weight 146 lbs - -  BMI 83.3 - -  Systolic 825 053 976  Diastolic 82 84 65  Pulse 920 84 102     Physical Exam Neck:     Vascular: No carotid bruit or JVD.  Cardiovascular:     Rate and Rhythm: Normal rate and regular rhythm.     Pulses: Intact distal pulses.     Heart sounds: Normal heart sounds. No murmur heard.   No gallop.  Pulmonary:     Effort: Pulmonary effort is normal.     Breath sounds: Examination of the left-lower field reveals rales. Rales present.  Abdominal:     General: Bowel sounds are normal.     Palpations: Abdomen is soft.  Musculoskeletal:        General: No swelling.     Laboratory examination:   No results for input(s): NA, K, CL, CO2, GLUCOSE, BUN, CREATININE, CALCIUM, GFRNONAA, GFRAA in the last 8760 hours. CrCl cannot be calculated (Patient's most recent lab result is older than the maximum 21 days allowed.).  CMP Latest Ref Rng & Units 02/28/2016 05/25/2009  Glucose 65 - 99 mg/dL 105(H) 141(H)  BUN 6 - 20 mg/dL 16 11  Creatinine 0.44 - 1.00 mg/dL 0.84 0.59  Sodium  135 - 145 mmol/L 140 143  Potassium 3.5 - 5.1 mmol/L 3.6 3.4(L)  Chloride 101 - 111 mmol/L 101 102  CO2 22 - 32 mmol/L 27 29  Calcium 8.9 - 10.3 mg/dL 10.5(H) 9.6  Total Protein 6.0 - 8.3 g/dL - 7.7  Total Bilirubin 0.3 - 1.2 mg/dL - 0.6  Alkaline Phos 39 - 117 U/L - 69  AST 0 - 37 U/L - 23  ALT 0 - 35 U/L - 23   CBC Latest Ref Rng & Units 02/28/2016 05/25/2009 05/25/2009  WBC 4.0 - 10.5 K/uL 10.4 11.2(H) 10.0 WHITE COUNT CONFIRMED ON SMEAR  Hemoglobin 12.0 - 15.0 g/dL 12.3 12.8 14.1  Hematocrit 36.0 - 46.0 % 36.6 36.9 40.4  Platelets 150 - 400 K/uL 397 381 314    Lipid Panel No results for input(s): CHOL, TRIG, LDLCALC, VLDL, HDL, CHOLHDL, LDLDIRECT in the last 8760 hours. Lipid Panel  No results found for: CHOL, TRIG, HDL, CHOLHDL, VLDL, LDLCALC, LDLDIRECT, LABVLDL   HEMOGLOBIN A1C No results found for: HGBA1C, MPG TSH No results for input(s): TSH in the last 8760 hours.  External labs:   Labs 06/04/2021:  S. glucose 94 mg, BUN 25, creatinine 1.14, EGFR 50 mL.  Calcium mildly elevated at 10.7.  A1c 5.9%.  Hb 12.6/HCT 38.5, platelets 423, elevated from upper limit 400.  Medications and allergies   Allergies  Allergen Reactions   Sulfa Antibiotics Hives    Other reaction(s): RASH     Medication prior to this encounter:   Outpatient Medications Prior to Visit  Medication Sig Dispense Refill   acetaminophen (TYLENOL) 500 MG tablet Take 1,000 mg by mouth every 8 (eight) hours as needed for mild pain or moderate pain.     amiloride-hydrochlorothiazide (MODURETIC) 5-50 MG tablet Take 1 tablet by mouth daily.     amLODipine (NORVASC) 5 MG tablet Take 5 mg by mouth daily.     b complex vitamins tablet Take 1 tablet by mouth daily.     baclofen (LIORESAL) 10 MG tablet Take 10 mg by mouth 3 (three) times daily as needed for muscle spasms.      Calcium Carb-Cholecalciferol (CALCIUM + D3) 600-200 MG-UNIT TABS Take 1 tablet by mouth daily.     citalopram (CELEXA) 20 MG tablet  Take 20 mg by mouth at bedtime.  fluticasone (FLOVENT HFA) 110 MCG/ACT inhaler Inhale 1 puff into the lungs 2 (two) times daily as needed.     gabapentin (NEURONTIN) 300 MG capsule Take 300 mg by mouth 2 (two) times daily.      magnesium oxide (MAG-OX) 400 (240 Mg) MG tablet Take 1 tablet by mouth 2 (two) times daily.     metoCLOPramide (REGLAN) 10 MG tablet Take 10 mg by mouth daily.     montelukast (SINGULAIR) 10 MG tablet Take 10 mg by mouth daily.     Multiple Vitamins-Minerals (CENTRUM SILVER PO) Take 1 tablet by mouth daily.      omeprazole (PRILOSEC) 40 MG capsule Take 40 mg by mouth daily.     rosuvastatin (CRESTOR) 5 MG tablet Take 5 mg by mouth at bedtime.     vitamin C (ASCORBIC ACID) 500 MG tablet Take 500 mg by mouth daily.     vitamin E 400 UNIT capsule Take 400 Units by mouth daily.     albuterol (PROVENTIL HFA;VENTOLIN HFA) 108 (90 Base) MCG/ACT inhaler Inhale 1 puff into the lungs every 6 (six) hours as needed for wheezing or shortness of breath.     cetirizine (ZYRTEC) 5 MG tablet Take 1 tablet (5 mg total) by mouth daily. 30 tablet 0   HYDROcodone-acetaminophen (NORCO/VICODIN) 5-325 MG tablet Take 1 tablet by mouth every 4 (four) hours as needed (pain). 75 tablet 0   ibuprofen (ADVIL,MOTRIN) 800 MG tablet Take 800 mg by mouth every 8 (eight) hours as needed for mild pain or moderate pain.      Krill Oil 1000 MG CAPS Take 1 capsule by mouth daily.     predniSONE (DELTASONE) 20 MG tablet Take 2 tablets daily with breakfast. 10 tablet 0   promethazine-dextromethorphan (PROMETHAZINE-DM) 6.25-15 MG/5ML syrup Take 5 mLs by mouth at bedtime as needed for cough. 100 mL 0   No facility-administered medications prior to visit.     Medication list after today's encounter   Current Outpatient Medications  Medication Instructions   acetaminophen (TYLENOL) 1,000 mg, Oral, Every 8 hours PRN   amiloride-hydrochlorothiazide (MODURETIC) 5-50 MG tablet 1 tablet, Daily   amLODipine  (NORVASC) 5 mg, Daily   b complex vitamins tablet 1 tablet, Oral, Daily   baclofen (LIORESAL) 10 mg, Oral, 3 times daily PRN   Calcium Carb-Cholecalciferol (CALCIUM + D3) 600-200 MG-UNIT TABS 1 tablet, Oral, Daily   citalopram (CELEXA) 20 mg, Oral, Daily at bedtime   fluticasone (FLOVENT HFA) 110 MCG/ACT inhaler 1 puff, Inhalation, 2 times daily PRN   gabapentin (NEURONTIN) 300 mg, Oral, 2 times daily   magnesium oxide (MAG-OX) 400 (240 Mg) MG tablet 1 tablet, Oral, 2 times daily   metoCLOPramide (REGLAN) 10 mg, Oral, Daily   montelukast (SINGULAIR) 10 mg, Oral, Daily   Multiple Vitamins-Minerals (CENTRUM SILVER PO) 1 tablet, Oral, Daily   omeprazole (PRILOSEC) 40 mg, Daily   rosuvastatin (CRESTOR) 5 mg, Oral, Daily at bedtime   vitamin C (ASCORBIC ACID) 500 mg, Oral, Daily   vitamin E 400 Units, Oral, Daily   Radiology:   CT of the chest 06/25/2021: 1. Marked elevation of the right hemidiaphragm with areas of atelectasis and/or scarring in the right middle lobe and right lower lobe, as above. 2. 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 Further evaluation with nonemergent esophagram is suggested to confirm the nature of this finding. 3. Aortic atherosclerosis, in addition to left anterior descending coronary artery  disease.   Cardiac Studies:   NA  EKG:   EKG 07/17/2021: Normal sinus rhythm at rate of 93 bpm, left axis deviation, left anterior fascicular block.  LVH by voltage in aVL.  No ST-T wave changes of ischemia.    Assessment     ICD-10-CM   1. Dyspnea on exertion  R06.00 PCV ECHOCARDIOGRAM COMPLETE    PCV MYOCARDIAL PERFUSION WITH LEXISCAN    Pro b natriuretic peptide (BNP)    2. Coronary artery calcification seen on CAT scan  I25.10     3. Aortic atherosclerosis (HCC)  I70.0     4. Pure hypercholesterolemia  E78.00 Lipid Panel With LDL/HDL Ratio    LDL cholesterol, direct    5.  Primary hypertension  I10 EKG 12-Lead       Medications Discontinued During This Encounter  Medication Reason   cetirizine (ZYRTEC) 5 MG tablet Error   promethazine-dextromethorphan (PROMETHAZINE-DM) 6.25-15 MG/5ML syrup Error   predniSONE (DELTASONE) 20 MG tablet Error   Krill Oil 1000 MG CAPS Error   ibuprofen (ADVIL,MOTRIN) 800 MG tablet Error   HYDROcodone-acetaminophen (NORCO/VICODIN) 5-325 MG tablet Error   albuterol (PROVENTIL HFA;VENTOLIN HFA) 108 (90 Base) MCG/ACT inhaler Error    No orders of the defined types were placed in this encounter.  Orders Placed This Encounter  Procedures   Lipid Panel With LDL/HDL Ratio   LDL cholesterol, direct   Pro b natriuretic peptide (BNP)   PCV MYOCARDIAL PERFUSION WITH LEXISCAN    Standing Status:   Future    Standing Expiration Date:   09/16/2021   EKG 12-Lead   PCV ECHOCARDIOGRAM COMPLETE    Standing Status:   Future    Standing Expiration Date:   07/17/2022   Recommendations:   DOROTHEE NAPIERKOWSKI is a 75 y.o. African-American female patient with hypertension, mild intermittent asthma, chronically elevated right hemidiaphragm with scarring in the right middle and lower lobe lung, stage IIIa chronic kidney disease, vitamin D deficiency is referred to me for evaluation of dyspnea on exertion and marked coronary calcification noted in the LAD and aortic atherosclerosis by recent CT scan of the chest.  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  Today fortunately there is no clinical evidence of heart failure, I will obtain a proBNP for baseline evaluation.  I reviewed her CT scan of the chest, she has chronically elevated left hemidiaphragm and probably her dyspnea may also be contributed by restrictive lung disease and also deconditioning.  She has significant amount of coronary calcification especially involving the LAD in the  proximal segment.  Will schedule for an echocardiogram. Schedule for a Exercise Nuclear stress test to evaluate for myocardial ischemia.  Blood pressure is well controlled, she has been on a statin at low-dose however I do not see any lipid profile having been performed previously, orders placed to get fasting lipid profile.  She already has an appointment to see pulmonary medicine, advised her to and encouraged her to keep the appointment.  I will see her back after the test and make further recommendations.  If cardiac work-up is low risk, evaluation for noncardiac causes for dyspnea is indicated.  She also has occasional coughing spells after she eats and this could be related to esophageal diverticula.    Adrian Prows, MD, [facility] 07/17/2021, 1:30 PM Office: [phone]

## 2021-07-18 LAB — LDL CHOLESTEROL, DIRECT: LDL Direct: 69 mg/dL (ref 0–99)

## 2021-07-18 LAB — PRO B NATRIURETIC PEPTIDE: NT-Pro BNP: 80 pg/mL (ref 0–738)

## 2021-07-18 LAB — LIPID PANEL WITH LDL/HDL RATIO
Cholesterol, Total: 158 mg/dL (ref 100–199)
HDL: 71 mg/dL (ref >39)
LDL Chol Calc (NIH): 73 mg/dL (ref 0–99)
LDL/HDL Ratio: 1 ratio (ref 0.0–3.2)
Triglycerides: 73 mg/dL (ref 0–149)
VLDL Cholesterol Cal: 14 mg/dL (ref 5–40)

## 2021-07-23 NOTE — Progress Notes (Signed)
Cholesterol under excellent control, proBNP does not suggest heart failure.  Labs are within normal limits.

## 2021-07-25 ENCOUNTER — Other Ambulatory Visit: Payer: Self-pay

## 2021-07-25 ENCOUNTER — Ambulatory Visit: Payer: Medicare PPO

## 2021-07-25 DIAGNOSIS — R0609 Other forms of dyspnea: Secondary | ICD-10-CM

## 2021-07-25 DIAGNOSIS — R0600 Dyspnea, unspecified: Secondary | ICD-10-CM

## 2021-07-31 ENCOUNTER — Institutional Professional Consult (permissible substitution): Payer: Medicare PPO | Admitting: Emergency Medicine

## 2021-08-20 ENCOUNTER — Other Ambulatory Visit: Payer: Medicare PPO

## 2021-08-22 ENCOUNTER — Other Ambulatory Visit: Payer: Medicare PPO

## 2021-08-30 ENCOUNTER — Institutional Professional Consult (permissible substitution): Payer: Medicare PPO | Admitting: Emergency Medicine

## 2021-09-05 ENCOUNTER — Ambulatory Visit: Payer: Medicare PPO

## 2021-09-05 ENCOUNTER — Other Ambulatory Visit: Payer: Self-pay

## 2021-09-05 DIAGNOSIS — R0609 Other forms of dyspnea: Secondary | ICD-10-CM

## 2021-09-06 LAB — PCV MYOCARDIAL PERFUSION WITH LEXISCAN: ST Depression (mm): 0 mm

## 2021-09-11 NOTE — Telephone Encounter (Signed)
From pt

## 2021-09-21 ENCOUNTER — Institutional Professional Consult (permissible substitution): Payer: Medicare PPO | Admitting: Emergency Medicine

## 2021-09-25 ENCOUNTER — Other Ambulatory Visit: Payer: Self-pay

## 2021-09-25 ENCOUNTER — Encounter: Payer: Self-pay | Admitting: Emergency Medicine

## 2021-09-25 ENCOUNTER — Ambulatory Visit: Payer: Medicare PPO | Admitting: Emergency Medicine

## 2021-09-25 VITALS — BP 132/78 | HR 92 | Temp 98.5°F | Ht 62.0 in | Wt 145.2 lb

## 2021-09-25 DIAGNOSIS — J9811 Atelectasis: Secondary | ICD-10-CM | POA: Diagnosis not present

## 2021-09-25 DIAGNOSIS — J4520 Mild intermittent asthma, uncomplicated: Secondary | ICD-10-CM

## 2021-09-25 DIAGNOSIS — R911 Solitary pulmonary nodule: Secondary | ICD-10-CM | POA: Diagnosis not present

## 2021-09-25 NOTE — Patient Instructions (Signed)
We will plan to repeat your CT scan of the chest in February 2023 to compare with your priors. Continue your Flovent 2 puffs twice a day.  Rinse and gargle after using. Keep your albuterol available use 2 puffs when you need it for shortness of breath, chest tightness, wheezing. Follow Dr. Lamonte Sakai in February after your CT so we can review the results together.

## 2021-09-25 NOTE — Progress Notes (Signed)
Subjective:    Patient ID: Deanna Rose, female    DOB: 1946/03/22, 75 y.o.   MRN: [id number]  HPI 75 year old former smoker (3 pack years) with a history of COPD/asthma, DVT, GERD/hiatal hernia, sickle cell trait, hypertension, remote CVA, uterine cancer.  She is referred today for evaluation of an abnormal CT scan of the chest.  She reports that she has had some progressive exertional SOB and resting SOB, can hear some intermittent wheeze. Relieved by albuterol  She is on flovent, rarely needs albuterol.    CT scan of the chest done on 06/22/2021 reviewed by me, shows an elevated right hemidiaphragm with associated right lower lobe and right middle lobe atelectasis.  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  Review of Systems As per HPI  Past Medical History:  Diagnosis Date   Arthritis    Asthma    Blood clot in vein    leg   1966   Cancer ([place])    UTERUS   1970S   GERD (gastroesophageal reflux disease)    Headache    Heart murmur    History of depression    without worsening   History of hiatal hernia    History of sickle cell trait    Hypertension    stable   Low back pain    with degenerative disc disease by MRI   Mild obesity    Right flank pain    radicular   Stroke ([place])    many years ago  1980s, left facial twitching takes botox      Family History  Problem Relation Age of Onset   Heart disease Mother    Hypertension Mother    Heart disease Father    Hypertension Father    Hypertension Sister    Heart disease Sister      Social History   Socioeconomic History   Marital status: Divorced    Spouse name: Not on file   Number of children: Not on file   Years of education: Not on file   Highest education level: Not on file  Occupational History   Not on file  Tobacco Use   Smoking status: Former    Packs/day: 0.25    Years: 12.00    Pack years: 3.00    Types: Cigarettes    Quit date: 1970    Years since quitting: 52.9    Smokeless tobacco: Never  Substance and Sexual Activity   Alcohol use: No   Drug use: No   Sexual activity: Not on file  Other Topics Concern   Not on file  Social History Narrative   Not on file   Social Determinants of Health   Financial Resource Strain: Not on file  Food Insecurity: Not on file  Transportation Needs: Not on file  Physical Activity: Not on file  Stress: Not on file  Social Connections: Not on file  Intimate Partner Violence: Not on file     Allergies  Allergen Reactions   Sulfa Antibiotics Hives    Other reaction(s): RASH     Outpatient Medications Prior to Visit  Medication Sig Dispense Refill   acetaminophen (TYLENOL) 500 MG tablet Take 1,000 mg by mouth every 8 (eight) hours as needed for mild pain or moderate pain.     amiloride-hydrochlorothiazide (MODURETIC) 5-50 MG tablet Take 1 tablet by mouth daily.     amLODipine (NORVASC) 5 MG tablet Take 5 mg by mouth daily.     b complex  vitamins tablet Take 1 tablet by mouth daily.     baclofen (LIORESAL) 10 MG tablet Take 10 mg by mouth 3 (three) times daily as needed for muscle spasms.      Calcium Carb-Cholecalciferol (CALCIUM + D3) 600-200 MG-UNIT TABS Take 1 tablet by mouth daily.     citalopram (CELEXA) 20 MG tablet Take 20 mg by mouth at bedtime.      fluticasone (FLOVENT HFA) 110 MCG/ACT inhaler Inhale 1 puff into the lungs 2 (two) times daily as needed.     gabapentin (NEURONTIN) 300 MG capsule Take 300 mg by mouth 2 (two) times daily.      magnesium oxide (MAG-OX) 400 (240 Mg) MG tablet Take 1 tablet by mouth 2 (two) times daily.     metoCLOPramide (REGLAN) 10 MG tablet Take 10 mg by mouth daily.     montelukast (SINGULAIR) 10 MG tablet Take 10 mg by mouth daily.     Multiple Vitamins-Minerals (CENTRUM SILVER PO) Take 1 tablet by mouth daily.      omeprazole (PRILOSEC) 40 MG capsule Take 40 mg by mouth daily.     rosuvastatin (CRESTOR) 5 MG tablet Take 5 mg by mouth at bedtime.     vitamin C  (ASCORBIC ACID) 500 MG tablet Take 500 mg by mouth daily.     vitamin E 400 UNIT capsule Take 400 Units by mouth daily.     No facility-administered medications prior to visit.         Objective:   Physical Exam Vitals:   09/25/21 1536  BP: 132/78  Pulse: 92  Temp: 98.5 F (36.9 C)  TempSrc: Oral  SpO2: 96%  Weight: 145 lb 3.2 oz (65.9 kg)  Height: 5\' 2"  (1.575 m)   Gen: Pleasant, well-nourished, in no distress,  normal affect  ENT: No lesions,  mouth clear,  oropharynx clear, no postnasal drip  Neck: No JVD, no stridor  Lungs: No use of accessory muscles, no crackles or wheezing on normal respiration, no wheeze on forced expiration  Cardiovascular: RRR, heart sounds normal, no murmur or gallops, no peripheral edema  Musculoskeletal: No deformities, no cyanosis or clubbing  Neuro: alert, awake, non focal  Skin: Warm, no lesions or rash      Assessment & Plan:  Atelectasis Right middle lobe and right lower lobe atelectasis in the setting of an elevated right hemidiaphragm.  Plan to repeat CT scan in 12/2021 to ensure interval stability.  Asthma Persistent intermittent symptoms.  She has progressive exertional shortness of breath also hears some wheeze that is relieved by albuterol.  We will discuss repeat pulmonary function testing going forward.  For now continue her Flovent and albuterol as needed.  Baltazar Apo, MD, PhD 11/01/2021, 8:25 AM [facility] [phone] or if no answer before 7:00PM call [phone] For any issues after 7:00PM please call eLink [phone]

## 2021-11-01 ENCOUNTER — Encounter: Payer: Self-pay | Admitting: Emergency Medicine

## 2021-11-01 DIAGNOSIS — J45909 Unspecified asthma, uncomplicated: Secondary | ICD-10-CM | POA: Insufficient documentation

## 2021-11-01 DIAGNOSIS — J9811 Atelectasis: Secondary | ICD-10-CM | POA: Insufficient documentation

## 2021-11-01 NOTE — Assessment & Plan Note (Signed)
Persistent intermittent symptoms.  She has progressive exertional shortness of breath also hears some wheeze that is relieved by albuterol.  We will discuss repeat pulmonary function testing going forward.  For now continue her Flovent and albuterol as needed.

## 2021-11-01 NOTE — Assessment & Plan Note (Signed)
Right middle lobe and right lower lobe atelectasis in the setting of an elevated right hemidiaphragm.  Plan to repeat CT scan in 12/2021 to ensure interval stability.

## 2022-01-08 ENCOUNTER — Ambulatory Visit (INDEPENDENT_AMBULATORY_CARE_PROVIDER_SITE_OTHER)
Admission: RE | Admit: 2022-01-08 | Discharge: 2022-01-08 | Disposition: A | Discharge status: Home / Self Care | Payer: Medicare PPO | Source: Ambulatory Visit | Attending: Emergency Medicine | Admitting: Emergency Medicine

## 2022-01-08 ENCOUNTER — Other Ambulatory Visit: Payer: Self-pay

## 2022-01-08 DIAGNOSIS — R911 Solitary pulmonary nodule: Secondary | ICD-10-CM

## 2022-01-14 NOTE — Progress Notes (Signed)
Primary Physician/Referring:  Rogers Blocker, MD  Patient ID: Deanna Rose, female    DOB: Jun 20, 1946, 76 y.o.   MRN: [id number]  Chief Complaint  Patient presents with   Shortness of Breath   Coronary calcification   Follow-up    6 months   HPI:    Deanna Rose  is a 76 y.o. African-American female patient with hypertension, mild intermittent asthma, chronically elevated right hemidiaphragm with scarring in the right middle and lower lobe lung, stage IIIa chronic kidney disease, vitamin D deficiency is referred to me for evaluation of dyspnea on exertion and marked coronary calcification noted in the LAD and aortic atherosclerosis by CT scan of the chest.  Patient presents for 68-month follow-up.  Last office visit ordered echocardiogram and stress test as well as repeat lipid profile testing and BNP.  BNP was within normal limits and lipids are well controlled.  Echocardiogram revealed normal LVEF at 60-65% with moderate LVH and normal diastolic function, as well as moderate TR and mild PR.  Stress test was overall low risk.   Patient is accompanied by her son at today's office visit.  Patient is feeling well overall and is essentially asymptomatic.  She does note that she is not very active, with no formal exercise routine.  Denies chest pain, dyspnea, palpitations.  There is no clinical evidence of heart failure at today's office visit.  Past Medical History:  Diagnosis Date   Arthritis    Asthma    Blood clot in vein    leg   1966   Cancer ([place])    UTERUS   1970S   GERD (gastroesophageal reflux disease)    Headache    Heart murmur    History of depression    without worsening   History of hiatal hernia    History of sickle cell trait    Hypertension    stable   Low back pain    with degenerative disc disease by MRI   Mild obesity    Right flank pain    radicular   Stroke ([place])    many years ago  1980s, left facial twitching takes botox    Past Surgical  History:  Procedure Laterality Date   BILATERAL CARPAL TUNNEL RELEASE     BREAST EXCISIONAL BIOPSY Left 1970   BREAST SURGERY     bil breast bx    2x each   LEG SURGERY     rt leg blood clot removed 1966  [facility]   UTERINE FIBROID SURGERY     CANCER REMOVED   Family History  Problem Relation Age of Onset   Heart disease Mother    Hypertension Mother    Heart disease Father    Hypertension Father    Hypertension Sister    Heart disease Sister     Social History   Tobacco Use   Smoking status: Former    Packs/day: 0.25    Years: 12.00    Pack years: 3.00    Types: Cigarettes    Quit date: 1970    Years since quitting: 53.1   Smokeless tobacco: Never  Substance Use Topics   Alcohol use: No   Marital Status: Divorced  ROS  Review of Systems  Constitutional: Negative for malaise/fatigue and weight gain.  Cardiovascular:  Positive for dyspnea on exertion (mild, stable). Negative for chest pain, claudication, leg swelling, near-syncope, orthopnea, palpitations, paroxysmal nocturnal dyspnea and syncope.  Neurological:  Negative for dizziness.   Objective  Blood pressure 122/72, pulse 93, temperature 97.9 F (36.6 C), temperature source Temporal, resp. rate 16, height $RemoveBe'5\' 2"'lxtHdnAow$  (1.575 m), weight 139 lb 6.4 oz (63.2 kg), SpO2 91 %. Body mass index is 25.5 kg/m.  Vitals with BMI 01/15/2022 09/25/2021 07/17/2021  Height $Remov'5\' 2"'aJBFIp$  $Remove'5\' 2"'RwzNiPO$  $RemoveB'5\' 2"'tZQDoHus$   Weight 139 lbs 6 oz 145 lbs 3 oz 146 lbs  BMI 25.49 62.13 08.6  Systolic 578 469 629  Diastolic 72 78 82  Pulse 93 92 103     Physical Exam Vitals reviewed.  Neck:     Vascular: No carotid bruit or JVD.  Cardiovascular:     Rate and Rhythm: Normal rate and regular rhythm.     Pulses: Intact distal pulses.     Heart sounds: Normal heart sounds. No murmur heard.   No gallop.  Pulmonary:     Effort: Pulmonary effort is normal.     Breath sounds: Examination of the left-lower field reveals rales. Rales present.  Musculoskeletal:     Right  lower leg: No edema.     Left lower leg: No edema.     Laboratory examination:   No results for input(s): NA, K, CL, CO2, GLUCOSE, BUN, CREATININE, CALCIUM, GFRNONAA, GFRAA in the last 8760 hours. CrCl cannot be calculated (Patient's most recent lab result is older than the maximum 21 days allowed.).  CMP Latest Ref Rng & Units 02/28/2016 05/25/2009  Glucose 65 - 99 mg/dL 105(H) 141(H)  BUN 6 - 20 mg/dL 16 11  Creatinine 0.44 - 1.00 mg/dL 0.84 0.59  Sodium 135 - 145 mmol/L 140 143  Potassium 3.5 - 5.1 mmol/L 3.6 3.4(L)  Chloride 101 - 111 mmol/L 101 102  CO2 22 - 32 mmol/L 27 29  Calcium 8.9 - 10.3 mg/dL 10.5(H) 9.6  Total Protein 6.0 - 8.3 g/dL - 7.7  Total Bilirubin 0.3 - 1.2 mg/dL - 0.6  Alkaline Phos 39 - 117 U/L - 69  AST 0 - 37 U/L - 23  ALT 0 - 35 U/L - 23   CBC Latest Ref Rng & Units 02/28/2016 05/25/2009 05/25/2009  WBC 4.0 - 10.5 K/uL 10.4 11.2(H) 10.0 WHITE COUNT CONFIRMED ON SMEAR  Hemoglobin 12.0 - 15.0 g/dL 12.3 12.8 14.1  Hematocrit 36.0 - 46.0 % 36.6 36.9 40.4  Platelets 150 - 400 K/uL 397 381 314    Lipid Panel Recent Labs    07/17/21 1403  CHOL 158  TRIG 73  LDLCALC 73  HDL 71  LDLDIRECT 69   Lipid Panel     Component Value Date/Time   CHOL 158 07/17/2021 1403   TRIG 73 07/17/2021 1403   HDL 71 07/17/2021 1403   LDLCALC 73 07/17/2021 1403   LDLDIRECT 69 07/17/2021 1403   LABVLDL 14 07/17/2021 1403     HEMOGLOBIN A1C No results found for: HGBA1C, MPG TSH No results for input(s): TSH in the last 8760 hours.  External labs:   Labs 06/04/2021:  S. glucose 94 mg, BUN 25, creatinine 1.14, EGFR 50 mL.  Calcium mildly elevated at 10.7.  A1c 5.9%.  Hb 12.6/HCT 38.5, platelets 423, elevated from upper limit 400.  Allergies   Allergies  Allergen Reactions   Sulfa Antibiotics Hives    Other reaction(s): RASH    Medication prior to this encounter:   Outpatient Medications Prior to Visit  Medication Sig Dispense Refill   acetaminophen (TYLENOL)  500 MG tablet Take 1,000 mg by mouth every 8 (eight) hours as needed for mild pain or moderate  pain.     amiloride-hydrochlorothiazide (MODURETIC) 5-50 MG tablet Take 1 tablet by mouth daily.     amLODipine (NORVASC) 5 MG tablet Take 5 mg by mouth daily.     b complex vitamins tablet Take 1 tablet by mouth daily.     baclofen (LIORESAL) 10 MG tablet Take 10 mg by mouth 3 (three) times daily as needed for muscle spasms.      Calcium Carb-Cholecalciferol (CALCIUM + D3) 600-200 MG-UNIT TABS Take 1 tablet by mouth daily.     citalopram (CELEXA) 20 MG tablet Take 20 mg by mouth at bedtime.      fluticasone (FLOVENT HFA) 110 MCG/ACT inhaler Inhale 1 puff into the lungs 2 (two) times daily as needed.     gabapentin (NEURONTIN) 300 MG capsule Take 300 mg by mouth 2 (two) times daily.      magnesium oxide (MAG-OX) 400 (240 Mg) MG tablet Take 1 tablet by mouth 2 (two) times daily.     metoCLOPramide (REGLAN) 10 MG tablet Take 10 mg by mouth daily.     montelukast (SINGULAIR) 10 MG tablet Take 10 mg by mouth daily.     Multiple Vitamins-Minerals (CENTRUM SILVER PO) Take 1 tablet by mouth daily.      omeprazole (PRILOSEC) 40 MG capsule Take 40 mg by mouth daily.     rosuvastatin (CRESTOR) 5 MG tablet Take 5 mg by mouth at bedtime.     vitamin C (ASCORBIC ACID) 500 MG tablet Take 500 mg by mouth daily.     vitamin E 400 UNIT capsule Take 400 Units by mouth daily.     No facility-administered medications prior to visit.     Medication list after today's encounter   Current Outpatient Medications  Medication Instructions   acetaminophen (TYLENOL) 1,000 mg, Oral, Every 8 hours PRN   amiloride-hydrochlorothiazide (MODURETIC) 5-50 MG tablet 1 tablet, Daily   amLODipine (NORVASC) 5 mg, Daily   b complex vitamins tablet 1 tablet, Oral, Daily   baclofen (LIORESAL) 10 mg, Oral, 3 times daily PRN   Calcium Carb-Cholecalciferol (CALCIUM + D3) 600-200 MG-UNIT TABS 1 tablet, Oral, Daily   citalopram (CELEXA) 20  mg, Oral, Daily at bedtime   fluticasone (FLOVENT HFA) 110 MCG/ACT inhaler 1 puff, Inhalation, 2 times daily PRN   gabapentin (NEURONTIN) 300 mg, Oral, 2 times daily   magnesium oxide (MAG-OX) 400 (240 Mg) MG tablet 1 tablet, Oral, 2 times daily   metoCLOPramide (REGLAN) 10 mg, Oral, Daily   montelukast (SINGULAIR) 10 mg, Oral, Daily   Multiple Vitamins-Minerals (CENTRUM SILVER PO) 1 tablet, Oral, Daily   omeprazole (PRILOSEC) 40 mg, Daily   rosuvastatin (CRESTOR) 5 mg, Oral, Daily at bedtime   vitamin C (ASCORBIC ACID) 500 mg, Oral, Daily   vitamin E 400 Units, Oral, Daily   Radiology:   CT of the chest 06/25/2021: 1. Marked elevation of the right hemidiaphragm with areas of atelectasis and/or scarring in the right middle lobe and right lower lobe, as above. 2. 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 Further evaluation with nonemergent esophagram is suggested to confirm the nature of this finding. 3. Aortic atherosclerosis, in addition to left anterior descending coronary artery disease.   Cardiac Studies:   PCV MYOCARDIAL PERFUSION WITH LEXISCAN 09/05/2021 Lexiscan nuclear stress test performed using 1-day protocol. Dyspnea, dizziness, chest pressure, hypotension reported after Lexiscan infusion. SPECT images show significant diaphragmatic attenuation at rest and stress images, likely responsible  for decreased tracer uptake in basal inferior myocardium. Stress LVEF 65%. Intermediate risk study.  PCV ECHOCARDIOGRAM COMPLETE 63/11/6008 Normal LV systolic function with visual EF 60-65%. Left ventricle cavity is normal in size. Moderate left ventricular hypertrophy. Normal global wall motion. Normal diastolic filling pattern, normal LAP. Moderate tricuspid regurgitation. No evidence of pulmonary hypertension. RVSP measures 32 mmHg. Mild pulmonic regurgitation. No prior study for comparison.   EKG:   01/15/2022: Sinus rhythm at a rate of 90 bpm.  Left axis, left anterior fascicular block.  LVH.  Compared to EKG 07/17/2021, no significant change.  EKG 07/17/2021: Normal sinus rhythm at rate of 93 bpm, left axis deviation, left anterior fascicular block.  LVH by voltage in aVL.  No ST-T wave changes of ischemia.    Assessment     ICD-10-CM   1. Coronary artery calcification seen on CAT scan  I25.10 EKG 12-Lead    2. Dyspnea on exertion  R06.09     3. Pure hypercholesterolemia  E78.00     4. LVH (left ventricular hypertrophy)  I51.7        There are no discontinued medications.   No orders of the defined types were placed in this encounter.   Orders Placed This Encounter  Procedures   EKG 12-Lead   Recommendations:   Deanna Rose is a 76 y.o. African-American female patient with hypertension, mild intermittent asthma, chronically elevated right hemidiaphragm with scarring in the right middle and lower lobe lung, stage IIIa chronic kidney disease, vitamin D deficiency is referred to me for evaluation of dyspnea on exertion and marked coronary calcification noted in the LAD and aortic atherosclerosis by recent CT scan of the chest.  Patient presents for 69-monthfollow-up.  Last office visit ordered echocardiogram and stress test as well as repeat lipid profile testing and BNP.  BNP was within normal limits and lipids are well controlled.  Echocardiogram revealed normal LVEF at 60-65% with moderate LVH and normal diastolic function, as well as moderate TR and mild PR.  Stress test was overall low risk.  Reviewed and discussed results of lab testing, echocardiogram, and stress test with patient and her son who is present at bedside.  Questions were addressed to their satisfaction.  Patient's blood pressure and lipids are well controlled.  There is no clinical evidence of heart failure and she remains essentially asymptomatic.  Suspect her dyspnea is related to chronically elevated  left hemidiaphragm.  Recommend patient follow-up with PCP and consider pulmonology evaluation for noncardiac causes of dyspnea.  I also suspect deconditioning is contributing to dyspnea, encourage patient to slowly increase physical activity as tolerated.  She is fairly stable from a cardiovascular standpoint.  Will not make changes to medications at this time.  Follow-up in 6 months, sooner if needed.   CAlethia Berthold PA-C 01/15/2022, 2:54 PM Office: [phone]

## 2022-01-15 ENCOUNTER — Encounter: Payer: Self-pay | Admitting: Student

## 2022-01-15 ENCOUNTER — Other Ambulatory Visit: Payer: Self-pay

## 2022-01-15 ENCOUNTER — Ambulatory Visit: Payer: Medicare PPO | Admitting: Student

## 2022-01-15 VITALS — BP 122/72 | HR 93 | Temp 97.9°F | Resp 16 | Ht 62.0 in | Wt 139.4 lb

## 2022-01-15 DIAGNOSIS — E7800 Pure hypercholesterolemia, unspecified: Secondary | ICD-10-CM

## 2022-01-15 DIAGNOSIS — I517 Cardiomegaly: Secondary | ICD-10-CM

## 2022-01-15 DIAGNOSIS — R0609 Other forms of dyspnea: Secondary | ICD-10-CM

## 2022-01-15 DIAGNOSIS — I2510 Atherosclerotic heart disease of native coronary artery without angina pectoris: Secondary | ICD-10-CM

## 2022-07-15 ENCOUNTER — Ambulatory Visit: Payer: Medicare PPO | Admitting: Internal Medicine

## 2022-07-15 ENCOUNTER — Ambulatory Visit: Payer: Medicare PPO | Admitting: Student

## 2022-07-30 ENCOUNTER — Ambulatory Visit: Payer: Medicare PPO | Admitting: Internal Medicine

## 2022-07-30 ENCOUNTER — Encounter: Payer: Self-pay | Admitting: Internal Medicine

## 2022-07-30 VITALS — BP 126/76 | HR 96 | Temp 98.3°F | Resp 16 | Ht 62.0 in | Wt 138.8 lb

## 2022-07-30 DIAGNOSIS — I517 Cardiomegaly: Secondary | ICD-10-CM | POA: Insufficient documentation

## 2022-07-30 DIAGNOSIS — I10 Essential (primary) hypertension: Secondary | ICD-10-CM | POA: Insufficient documentation

## 2022-07-30 DIAGNOSIS — I2510 Atherosclerotic heart disease of native coronary artery without angina pectoris: Secondary | ICD-10-CM | POA: Insufficient documentation

## 2022-07-30 NOTE — Progress Notes (Signed)
Primary Physician/Referring:  Rogers Blocker, MD  Patient ID: Deanna Rose, female    DOB: Jan 23, 1946, 76 y.o.   MRN: [id number]  Chief Complaint  Patient presents with  . LVH  . Hypertension  . Follow-up    6 months   HPI:    Deanna Rose  is a 76 y.o. African-American female patient with hypertension, mild intermittent asthma, stage IIIa chronic kidney disease.  Patient presents for 37-monthfollow-up.  Last office visit ordered echocardiogram and stress test as well as repeat lipid profile testing and BNP.  BNP was within normal limits and lipids are well controlled.  Echocardiogram revealed normal LVEF at 60-65% with moderate LVH and normal diastolic function, as well as moderate TR and mild PR.  Stress test was overall low risk.  Denies chest pain, shortness of breath, palpitations, diaphoresis, syncope, edema, PND, orthopnea.   Past Medical History:  Diagnosis Date  . Arthritis   . Asthma   . Blood clot in vein    leg   1966  . [place]    UTERUS   1970S  . GERD (gastroesophageal reflux disease)   . Headache   . Heart murmur   . History of depression    without worsening  . History of hiatal hernia   . History of sickle cell trait   . Hypertension    stable  . Low back pain    with degenerative disc disease by MRI  . Mild obesity   . Right flank pain    radicular  . Stroke ([facility]    many years ago  1980s, left facial twitching takes botox    Past Surgical History:  Procedure Laterality Date  . BILATERAL CARPAL TUNNEL RELEASE    . BREAST EXCISIONAL BIOPSY Left 1970  . BREAST SURGERY     bil breast bx    2x each  . LEG SURGERY     rt leg blood clot removed 1966  [facility]  . UTERINE FIBROID SURGERY     CANCER REMOVED   Family History  Problem Relation Age of Onset  . Heart disease Mother   . Hypertension Mother   . Heart disease Father   . Hypertension Father   . Hypertension Sister   . Heart disease Sister     Social History   Tobacco  Use  . Smoking status: Former    Packs/day: 0.25    Years: 12.00    Total pack years: 3.00    Types: Cigarettes    Quit date: 1970    Years since quitting: 53.7  . Smokeless tobacco: Never  Substance Use Topics  . Alcohol use: No   Marital Status: Divorced  ROS  Review of Systems  Constitutional: Negative for malaise/fatigue and weight gain.  Cardiovascular:  Negative for chest pain, claudication, dyspnea on exertion, leg swelling, near-syncope, orthopnea, palpitations, paroxysmal nocturnal dyspnea and syncope.  Neurological:  Negative for dizziness.    Objective  Blood pressure 126/76, pulse 96, temperature 98.3 F (36.8 C), temperature source Temporal, resp. rate 16, height _0  (1.575 m), weight 138 lb 12.8 oz (63 kg), SpO2 92 %. Body mass index is 25.39 kg/m.     07/30/2022    1:39 PM 01/15/2022    1:11 PM 09/25/2021    3:36 PM  Vitals with BMI  Height _1  _2  _3   Weight 138 lbs 13 oz 139 lbs 6 oz 145 lbs 3 oz  BMI 25.38 25.49 26.55  Systolic 301 601 093  Diastolic 76 72 78  Pulse 96 93 92     Physical Exam Vitals and nursing note reviewed.  HENT:     Head: Normocephalic and atraumatic.  Neck:     Vascular: No carotid bruit or JVD.  Cardiovascular:     Rate and Rhythm: Normal rate and regular rhythm.     Pulses: Intact distal pulses.     Heart sounds: Normal heart sounds. No murmur heard.    No gallop.  Pulmonary:     Effort: Pulmonary effort is normal.  Abdominal:     General: Bowel sounds are normal.     Palpations: Abdomen is soft.  Musculoskeletal:     Right lower leg: No edema.     Left lower leg: No edema.  Skin:    General: Skin is warm and dry.     Laboratory examination:   No results for input(s): "NA", "K", "CL", "CO2", "GLUCOSE", "BUN", "CREATININE", "CALCIUM", "GFRNONAA", "GFRAA" in the last 8760 hours. CrCl cannot be calculated (Patient's most recent lab result is older than the maximum 21 days allowed.).     Latest Ref Rng &  Units 02/28/2016    1:33 PM 05/25/2009    2:20 PM  CMP  Glucose 65 - 99 mg/dL 105  141   BUN 6 - 20 mg/dL 16  11   Creatinine 0.44 - 1.00 mg/dL 0.84  0.59   Sodium 135 - 145 mmol/L 140  143   Potassium 3.5 - 5.1 mmol/L 3.6  3.4   Chloride 101 - 111 mmol/L 101  102   CO2 22 - 32 mmol/L 27  29   Calcium 8.9 - 10.3 mg/dL 10.5  9.6   Total Protein 6.0 - 8.3 g/dL  7.7   Total Bilirubin 0.3 - 1.2 mg/dL  0.6   Alkaline Phos 39 - 117 U/L  69   AST 0 - 37 U/L  23   ALT 0 - 35 U/L  23       Latest Ref Rng & Units 02/28/2016    1:33 PM 05/25/2009    2:20 PM 05/25/2009    1:01 PM  CBC  WBC 4.0 - 10.5 K/uL 10.4  11.2  10.0 WHITE COUNT CONFIRMED ON SMEAR   Hemoglobin 12.0 - 15.0 g/dL 12.3  12.8  14.1   Hematocrit 36.0 - 46.0 % 36.6  36.9  40.4   Platelets 150 - 400 K/uL 397  381  314     Lipid Panel No results for input(s): "CHOL", "TRIG", "LDLCALC", "VLDL", "HDL", "CHOLHDL", "LDLDIRECT" in the last 8760 hours.  Lipid Panel     Component Value Date/Time   CHOL 158 07/17/2021 1403   TRIG 73 07/17/2021 1403   HDL 71 07/17/2021 1403   LDLCALC 73 07/17/2021 1403   LDLDIRECT 69 07/17/2021 1403   LABVLDL 14 07/17/2021 1403     HEMOGLOBIN A1C No results found for: "HGBA1C", "MPG" TSH No results for input(s): "TSH" in the last 8760 hours.  External labs:   Labs 06/04/2021:  S. glucose 94 mg, BUN 25, creatinine 1.14, EGFR 50 mL.  Calcium mildly elevated at 10.7.  A1c 5.9%.  Hb 12.6/HCT 38.5, platelets 423, elevated from upper limit 400.  Allergies   Allergies  Allergen Reactions  . Sulfa Antibiotics Hives    Other reaction(s): RASH    Medication prior to this encounter:   Outpatient Medications Prior to Visit  Medication Sig Dispense Refill  . acetaminophen (TYLENOL) 500 MG  tablet Take 1,000 mg by mouth every 8 (eight) hours as needed for mild pain or moderate pain.    Marland Kitchen amiloride-hydrochlorothiazide (MODURETIC) 5-50 MG tablet Take 1 tablet by mouth daily.    Marland Kitchen amLODipine  (NORVASC) 5 MG tablet Take 5 mg by mouth daily.    Marland Kitchen b complex vitamins tablet Take 1 tablet by mouth daily.    . baclofen (LIORESAL) 10 MG tablet Take 10 mg by mouth 3 (three) times daily as needed for muscle spasms.     . Calcium Carb-Cholecalciferol (CALCIUM + D3) 600-200 MG-UNIT TABS Take 1 tablet by mouth daily.    . citalopram (CELEXA) 20 MG tablet Take 20 mg by mouth at bedtime.     . fluticasone (FLOVENT HFA) 110 MCG/ACT inhaler Inhale 1 puff into the lungs 2 (two) times daily as needed.    . gabapentin (NEURONTIN) 300 MG capsule Take 300 mg by mouth 2 (two) times daily.     . magnesium oxide (MAG-OX) 400 (240 Mg) MG tablet Take 1 tablet by mouth 2 (two) times daily.    . memantine (NAMENDA) 10 MG tablet Take 10 mg by mouth at bedtime.    . metoCLOPramide (REGLAN) 10 MG tablet Take 10 mg by mouth daily.    . montelukast (SINGULAIR) 10 MG tablet Take 10 mg by mouth daily.    . Multiple Vitamins-Minerals (CENTRUM SILVER PO) Take 1 tablet by mouth daily.     Marland Kitchen omeprazole (PRILOSEC) 40 MG capsule Take 40 mg by mouth daily.    . rosuvastatin (CRESTOR) 5 MG tablet Take 5 mg by mouth at bedtime.    . vitamin C (ASCORBIC ACID) 500 MG tablet Take 500 mg by mouth daily.    . vitamin E 400 UNIT capsule Take 400 Units by mouth daily.     No facility-administered medications prior to visit.     Medication list after today's encounter   Current Outpatient Medications  Medication Instructions  . acetaminophen (TYLENOL) 1,000 mg, Oral, Every 8 hours PRN  . amiloride-hydrochlorothiazide (MODURETIC) 5-50 MG tablet 1 tablet, Daily  . amLODipine (NORVASC) 5 mg, Daily  . ascorbic acid (VITAMIN C) 500 mg, Oral, Daily  . b complex vitamins tablet 1 tablet, Oral, Daily  . baclofen (LIORESAL) 10 mg, Oral, 3 times daily PRN  . Calcium Carb-Cholecalciferol (CALCIUM + D3) 600-200 MG-UNIT TABS 1 tablet, Oral, Daily  . citalopram (CELEXA) 20 mg, Oral, Daily at bedtime  . fluticasone (FLOVENT HFA) 110  MCG/ACT inhaler 1 puff, Inhalation, 2 times daily PRN  . gabapentin (NEURONTIN) 300 mg, Oral, 2 times daily  . magnesium oxide (MAG-OX) 400 (240 Mg) MG tablet 1 tablet, Oral, 2 times daily  . memantine (NAMENDA) 10 mg, Oral, Daily at bedtime  . metoCLOPramide (REGLAN) 10 mg, Oral, Daily  . montelukast (SINGULAIR) 10 mg, Oral, Daily  . Multiple Vitamins-Minerals (CENTRUM SILVER PO) 1 tablet, Oral, Daily  . omeprazole (PRILOSEC) 40 mg, Daily  . rosuvastatin (CRESTOR) 5 mg, Oral, Daily at bedtime  . vitamin E 400 Units, Oral, Daily   Radiology:   CT of the chest 06/25/2021: 1. Marked elevation of the right hemidiaphragm with areas of atelectasis and/or scarring in the right middle lobe and right lower lobe, as above. 2. 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 Further evaluation with nonemergent esophagram is suggested to confirm the nature of this finding. 3. Aortic atherosclerosis, in addition to left anterior descending coronary  artery disease.   Cardiac Studies:   PCV MYOCARDIAL PERFUSION WITH LEXISCAN 09/05/2021 Lexiscan nuclear stress test performed using 1-day protocol. Dyspnea, dizziness, chest pressure, hypotension reported after Lexiscan infusion. SPECT images show significant diaphragmatic attenuation at rest and stress images, likely responsible for decreased tracer uptake in basal inferior myocardium. Stress LVEF 65%. Intermediate risk study.  PCV ECHOCARDIOGRAM COMPLETE 85/50/1586 Normal LV systolic function with visual EF 60-65%. Left ventricle cavity is normal in size. Moderate left ventricular hypertrophy. Normal global wall motion. Normal diastolic filling pattern, normal LAP. Moderate tricuspid regurgitation. No evidence of pulmonary hypertension. RVSP measures 32 mmHg. Mild pulmonic regurgitation. No prior study for comparison.   EKG:  01/15/2022: Sinus rhythm at a rate of 90 bpm.   Left axis, left anterior fascicular block.  LVH.  Compared to EKG 07/17/2021, no significant change.  EKG 07/17/2021: Normal sinus rhythm at rate of 93 bpm, left axis deviation, left anterior fascicular block.  LVH by voltage in aVL.  No ST-T wave changes of ischemia.    Assessment     ICD-10-CM   1. Coronary artery calcification seen on CAT scan  I25.10     2. Primary hypertension  I10     3. LVH (left ventricular hypertrophy)  I51.7        There are no discontinued medications.   No orders of the defined types were placed in this encounter.   No orders of the defined types were placed in this encounter.  Recommendations:   JOYCE HEITMAN is a 76 y.o. African-American female patient with hypertension, mild intermittent asthma, chronically elevated right hemidiaphragm with scarring in the right middle and lower lobe lung, stage IIIa chronic kidney disease, vitamin D deficiency is referred to me for evaluation of dyspnea on exertion and marked coronary calcification noted in the LAD and aortic atherosclerosis by recent CT scan of the chest.  Continue current cardiac medications. Encourage low-sodium diet, less than 2000 mg daily. Follow-up in 6 months, sooner if needed.    Floydene Flock, DO, [facility] 07/30/2022, 1:50 PM Office: [phone]

## 2022-12-29 IMAGING — CT CT CHEST W/O CM
2 of 4 series · 15 of 36 positions shown, 18 images · non-contrast
Comparison: 06/22/2021

CLINICAL DATA: Pulmonary nodule



[Series 2: thorax · axial · 0.64mm/px · z∈[-284,-54]mm · 12 of 135 slices shown, 15 images]
[im 10/135  mediastinal]
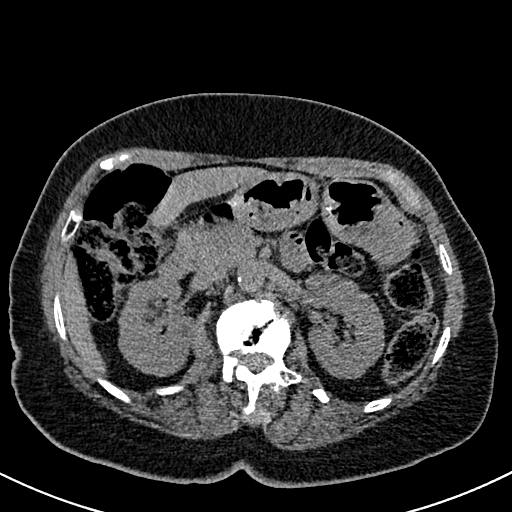
[im 10/135  lung]
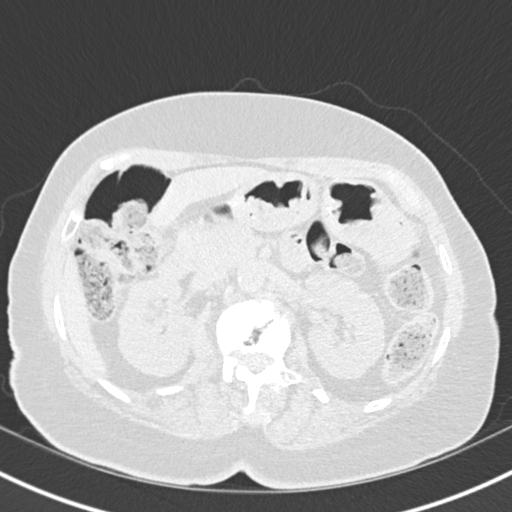
[im 20/135  lung]
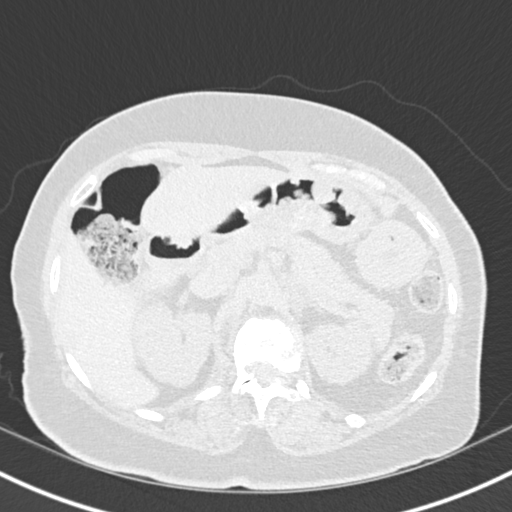
[im 29/135  lung]
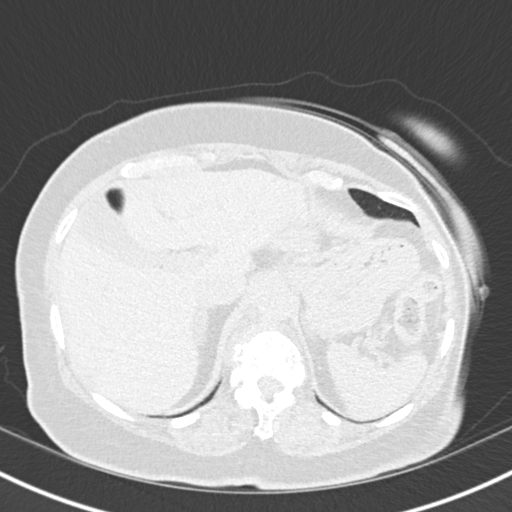
[im 39/135  lung]
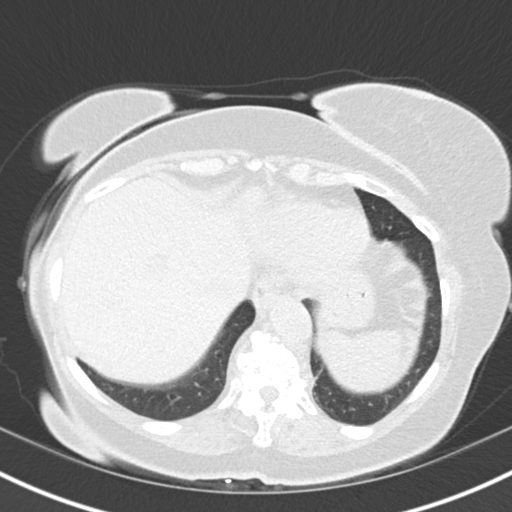
[im 48/135  mediastinal]
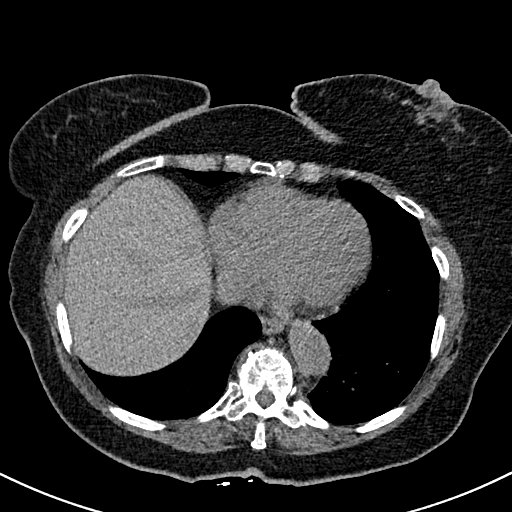
[im 48/135  lung]
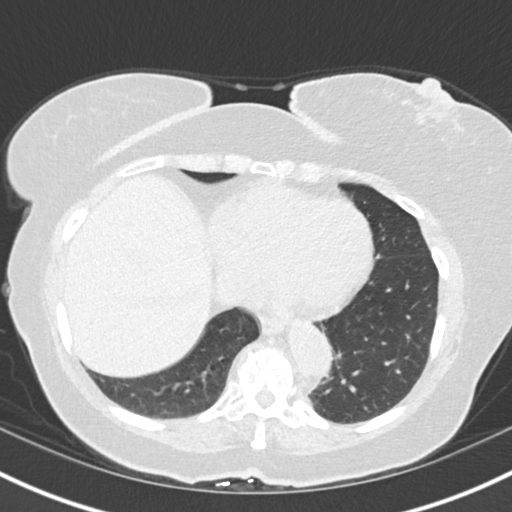
[im 58/135  lung]
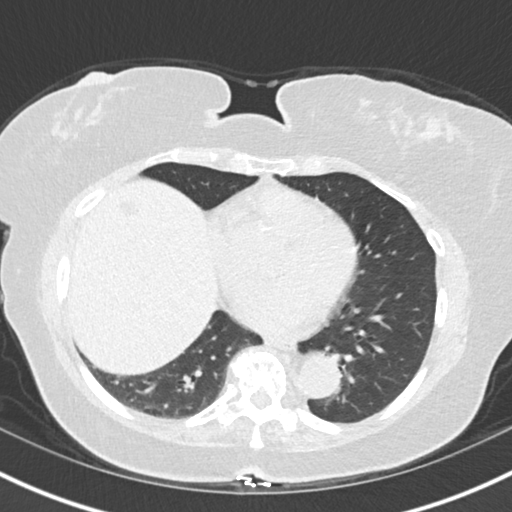
[im 77/135  lung]
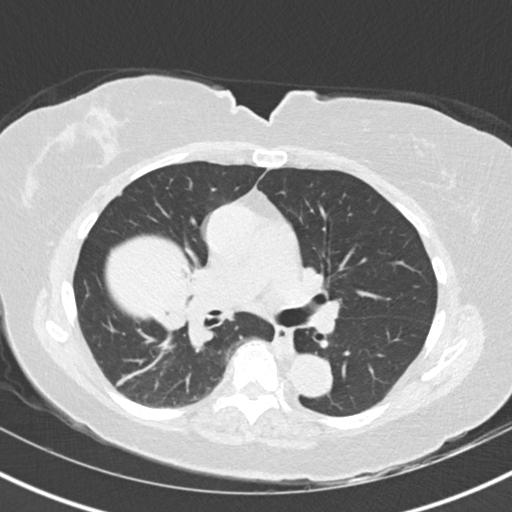
[im 87/135  lung]
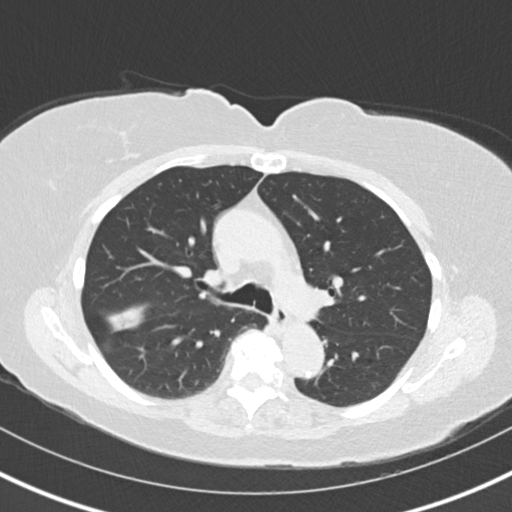
[im 96/135  mediastinal]
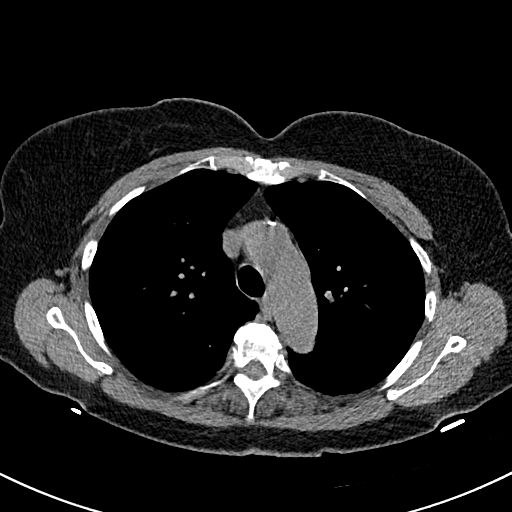
[im 96/135  lung]
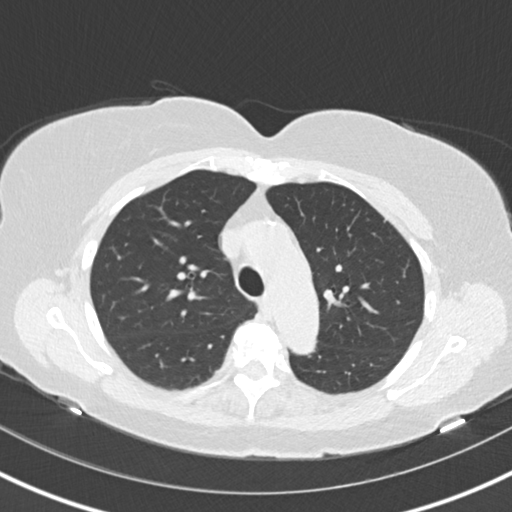
[im 106/135  lung]
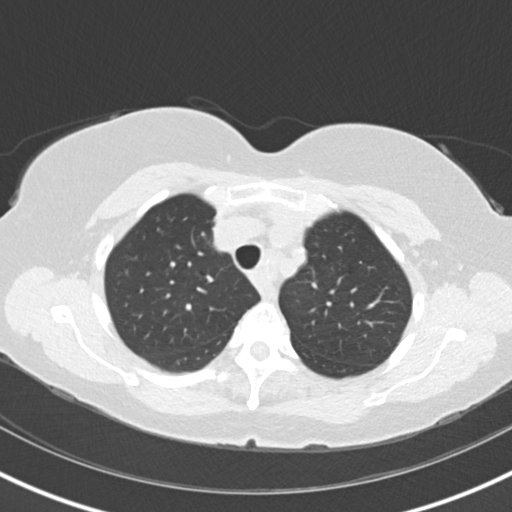
[im 115/135  lung]
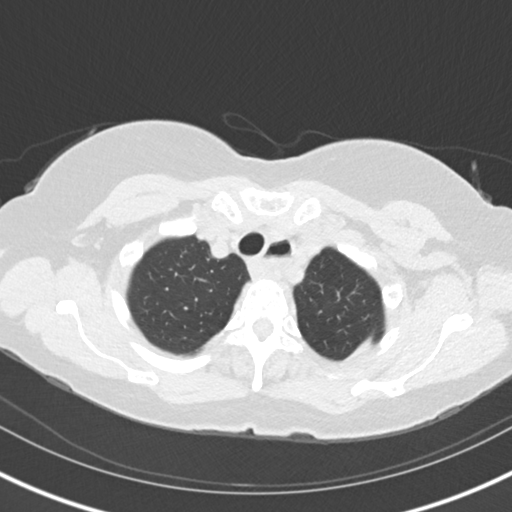
[im 125/135  lung]
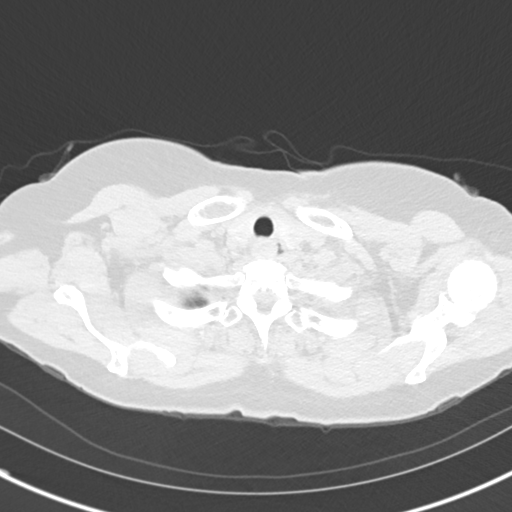

[Series 5: coronal · coronal · 0.59mm/px · 3 of 110 slices shown]
[im 22/110  lung]
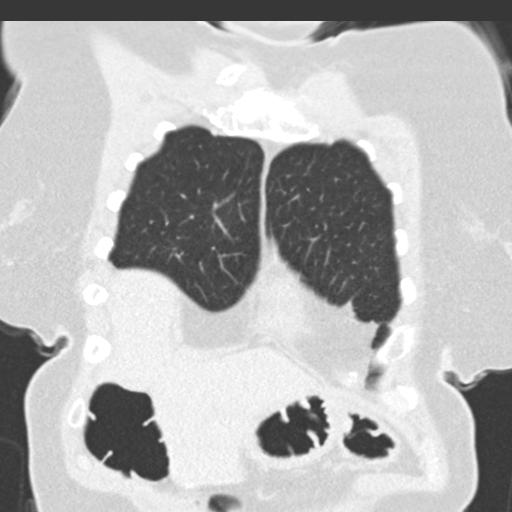
[im 44/110  lung]
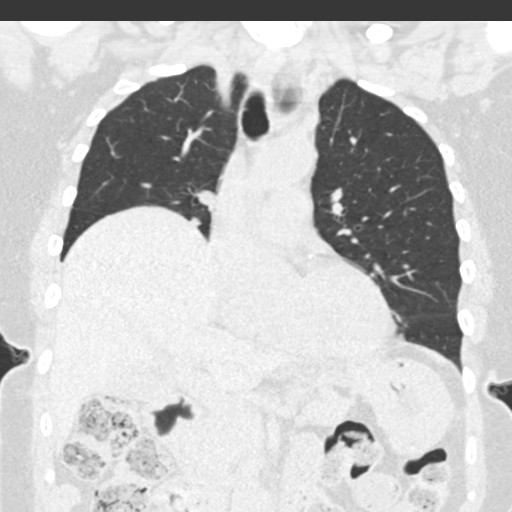
[im 66/110  lung]
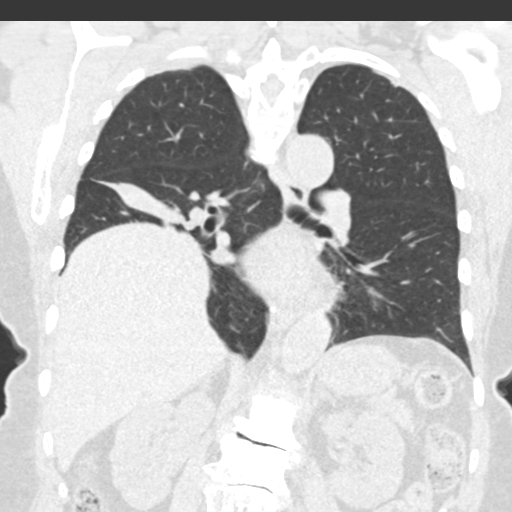

[15 of 36 positions shown; findings below may reference images not displayed]

FINDINGS: Cardiovascular: Aortic atherosclerosis. Normal heart size. Left and
right coronary artery calcifications. No pericardial effusion.

Mediastinum/Nodes: No enlarged mediastinal, hilar, or axillary lymph
nodes. Unchanged air and fluid filled diverticulum at the left
aspect of the esophagus near the thoracic inlet, measuring 2.7 x
cm (series 2, image 23) thyroid gland, trachea, and lower esophagus
demonstrate no significant findings.

Lungs/Pleura: Unchanged severe elevation of the right hemidiaphragm
with associated bandlike scarring or atelectasis, including near
complete consolidation and fibrosis of the right middle lobe. No
pleural effusion or pneumothorax.

Upper Abdomen: No acute abnormality.

Musculoskeletal: No chest wall abnormality. No suspicious osseous
lesions identified.
IMPRESSION: 1. Unchanged severe elevation of the right hemidiaphragm with
associated bandlike scarring or atelectasis, including near complete
consolidation and fibrosis of the right middle lobe, generally
consistent with sequelae of prior infection or inflammation. No
obvious mass.
2. Unchanged air and fluid filled diverticulum at the left aspect of
the esophagus near the thoracic inlet, measuring 2.7 x 1.9 cm.
3. Coronary artery disease.

Aortic Atherosclerosis ([id number]).

## 2023-01-14 ENCOUNTER — Other Ambulatory Visit: Payer: Self-pay | Admitting: Internal Medicine

## 2023-01-14 DIAGNOSIS — N6452 Nipple discharge: Secondary | ICD-10-CM

## 2023-01-28 ENCOUNTER — Ambulatory Visit
Admission: RE | Admit: 2023-01-28 | Discharge: 2023-01-28 | Disposition: A | Discharge status: Home / Self Care | Payer: Medicare PPO | Source: Ambulatory Visit | Attending: Internal Medicine | Admitting: Internal Medicine

## 2023-01-28 DIAGNOSIS — N6452 Nipple discharge: Secondary | ICD-10-CM

## 2023-01-30 ENCOUNTER — Other Ambulatory Visit: Payer: Self-pay | Admitting: Internal Medicine

## 2023-01-30 DIAGNOSIS — N6310 Unspecified lump in the right breast, unspecified quadrant: Secondary | ICD-10-CM

## 2023-01-31 ENCOUNTER — Encounter: Payer: Self-pay | Admitting: Internal Medicine

## 2023-02-06 ENCOUNTER — Ambulatory Visit
Admission: RE | Admit: 2023-02-06 | Discharge: 2023-02-06 | Disposition: A | Discharge status: Home / Self Care | Payer: Medicare PPO | Source: Ambulatory Visit | Attending: Internal Medicine | Admitting: Internal Medicine

## 2023-02-06 DIAGNOSIS — N6310 Unspecified lump in the right breast, unspecified quadrant: Secondary | ICD-10-CM

## 2023-02-06 HISTORY — PX: BREAST BIOPSY: SHX20

## 2023-02-07 ENCOUNTER — Telehealth: Payer: Self-pay | Admitting: Hematology and Oncology

## 2023-02-07 NOTE — Telephone Encounter (Signed)
Spoke to patient to confirm upcoming morning [facility] clinic appointment on 3/27, paperwork will be sent via e-mail.   Gave location and time, also informed patient that the surgeon's office would be calling as well to get information from them similar to the packet that they will be receiving so make sure to do both.  Reminded patient that all providers will be coming to the clinic to see them HERE and if they had any questions to not hesitate to reach back out to myself or their navigators.

## 2023-02-10 ENCOUNTER — Other Ambulatory Visit: Payer: Self-pay | Admitting: *Deleted

## 2023-02-10 NOTE — Progress Notes (Signed)
Radiation Oncology         [phone] ________________________________  Name: Deanna Rose        MRN: [id number]  Date of Service: 02/12/2023 DOB: February 16, 1946  [name], [name], MD  [name], MD     REFERRING PHYSICIAN: Autumn Messing III, MD   DIAGNOSIS: There were no encounter diagnoses.   HISTORY OF PRESENT ILLNESS: Deanna Rose is a 77 y.o. female seen in the multidisciplinary breast clinic for a new diagnosis of right breast cancer. The patient was noted to have rust colored discharge from the right nipple. Diagnostic mammogram and ultrasound on 01/28/23 revealed a 1.6 cm intraductal mass in the right breast 7 o'clock position 2 cmfn and a 0.9 cm intraductal mass in the right breast at the 7 o'clock position 1 cmfn. No axillary adenopathy was appreciated. Patient proceeded with a core needle biopsy of both masses on 02/06/23. 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 Prognositcs revealed ***.   She is seen today to discuss treatment recommendations of her cancer.      PREVIOUS RADIATION THERAPY: {EXAM; YES/NO:19492::"No"}   PAST MEDICAL HISTORY:  Past Medical History:  Diagnosis Date   Arthritis    Asthma    Blood clot in vein    leg   1966   Cancer ([place])    UTERUS   1970S   GERD (gastroesophageal reflux disease)    Headache    Heart murmur    History of depression    without worsening   History of hiatal hernia    History of sickle cell trait    Hypertension    stable   Low back pain    with degenerative disc disease by MRI   Mild obesity    Right flank pain    radicular   Stroke ([place])    many years ago  1980s, left facial twitching takes botox        PAST SURGICAL HISTORY: Past Surgical History:  Procedure Laterality Date   BILATERAL CARPAL TUNNEL RELEASE     BREAST BIOPSY Right 02/06/2023   [place] RT BREAST BX W LOC DEV 1ST LESION IMG BX [place] US GUIDE 02/06/2023  [facility]MO[facility]AP[facility]   BREAST BIOPSY Right 02/06/2023   [place] RT BREAST BX W LOC DEV EA ADD LESION IMG BX SPEC US GUIDE 02/06/2023 [facility] M[facility]MO[facility]   BREAST EXCISIONAL BIOPSY Left 1970   BREAST SURGERY     bil breast bx    2x each   LEG SURGERY     rt leg blood clot removed 1966  [facility]   UTERINE FIBROID SURGERY     CANCER REMOVED     FAMILY HISTORY:  Family History  Problem Relation Age of Onset   Heart disease Mother    Hypertension Mother    Heart disease Father    Hypertension Father    Hypertension Sister    Heart disease Sister      SOCIAL HISTORY:  reports that she quit smoking about 54 years ago. Her smoking use included cigarettes. She has a 3.00 pack-year smoking history. She has never used smokeless tobacco. She reports that she does not drink alcohol and does not use drugs.   ALLERGIES: Sulfa antibiotics   MEDICATIONS:  Current Outpatient Medications  Medication Sig Dispense Refill   acetaminophen (TYLENOL) 500 MG tablet Take 1,000 mg by mouth every 8 (eight) hours as needed for mild pain or moderate  pain.     amiloride-hydrochlorothiazide (MODURETIC) 5-50 MG tablet Take 1 tablet by mouth daily.     amLODipine (NORVASC) 5 MG tablet Take 5 mg by mouth daily.     b complex vitamins tablet Take 1 tablet by mouth daily.     baclofen (LIORESAL) 10 MG tablet Take 10 mg by mouth 3 (three) times daily as needed for muscle spasms.      Calcium Carb-Cholecalciferol (CALCIUM + D3) 600-200 MG-UNIT TABS Take 1 tablet by mouth daily.     citalopram (CELEXA) 20 MG tablet Take 20 mg by mouth at bedtime.      fluticasone (FLOVENT HFA) 110 MCG/ACT inhaler Inhale 1 puff into the lungs 2 (two) times daily as needed.     gabapentin (NEURONTIN) 300 MG capsule Take 300 mg by mouth 2 (two) times daily.      magnesium oxide (MAG-OX) 400 (240 Mg) MG tablet Take 1 tablet by mouth 2 (two) times daily.     memantine (NAMENDA) 10 MG tablet Take 10 mg by mouth at bedtime.     metoCLOPramide  (REGLAN) 10 MG tablet Take 10 mg by mouth daily.     montelukast (SINGULAIR) 10 MG tablet Take 10 mg by mouth daily.     Multiple Vitamins-Minerals (CENTRUM SILVER PO) Take 1 tablet by mouth daily.      omeprazole (PRILOSEC) 40 MG capsule Take 40 mg by mouth daily.     rosuvastatin (CRESTOR) 5 MG tablet Take 5 mg by mouth at bedtime.     vitamin C (ASCORBIC ACID) 500 MG tablet Take 500 mg by mouth daily.     vitamin E 400 UNIT capsule Take 400 Units by mouth daily.     No current facility-administered medications for this visit.     REVIEW OF SYSTEMS: On review of systems, the patient reports that she is doing well overall. No breast specific complaints are verbalized.  ***      PHYSICAL EXAM:  Wt Readings from Last 3 Encounters:  07/30/22 138 lb 12.8 oz (63 kg)  01/15/22 139 lb 6.4 oz (63.2 kg)  09/25/21 145 lb 3.2 oz (65.9 kg)   Temp Readings from Last 3 Encounters:  07/30/22 98.3 F (36.8 C) (Temporal)  01/15/22 97.9 F (36.6 C) (Temporal)  09/25/21 98.5 F (36.9 C) (Oral)   BP Readings from Last 3 Encounters:  07/30/22 126/76  01/15/22 122/72  09/25/21 132/78   Pulse Readings from Last 3 Encounters:  07/30/22 96  01/15/22 93  09/25/21 92    In general this is a well appearing *** female in no acute distress. She's alert and oriented x4 and appropriate throughout the examination. Cardiopulmonary assessment is negative for acute distress and she exhibits normal effort. Bilateral breast exam is deferred.    ECOG = ***  0 - Asymptomatic (Fully active, able to carry on all predisease activities without restriction)  1 - Symptomatic but completely ambulatory (Restricted in physically strenuous activity but ambulatory and able to carry out work of a light or sedentary nature. For example, light housework, office work)  2 - Symptomatic, <50% in bed during the day (Ambulatory and capable of all self care but unable to carry out any work activities. Up and about more than  50% of waking hours)  3 - Symptomatic, >50% in bed, but not bedbound (Capable of only limited self-care, confined to bed or chair 50% or more of waking hours)  4 - Bedbound (Completely disabled. Cannot carry on any self-care.  Totally confined to bed or chair)  5 - Death   Eustace Pen MM, Creech RH, Tormey DC, et al. [phone]). "Toxicity and response criteria of the [facility]". [place] Oncol. 5 (6): 649-55    LABORATORY DATA:  Lab Results  Component Value Date   WBC 10.4 02/28/2016   HGB 12.3 02/28/2016   HCT 36.6 02/28/2016   MCV 84.9 02/28/2016   PLT 397 02/28/2016   Lab Results  Component Value Date   NA 140 02/28/2016   K 3.6 02/28/2016   CL 101 02/28/2016   CO2 27 02/28/2016   Lab Results  Component Value Date   ALT 23 05/25/2009   AST 23 05/25/2009   ALKPHOS 69 05/25/2009   BILITOT 0.6 05/25/2009      RADIOGRAPHY: MM CLIP PLACEMENT RIGHT  Result Date: 02/06/2023 CLINICAL DATA:  Status post ultrasound-guided core needle biopsy adjacent right breast masses 7 o'clock position EXAM: 3D DIAGNOSTIC RIGHT MAMMOGRAM POST ULTRASOUND BIOPSY COMPARISON:  Previous exam(s). FINDINGS: 3D Mammographic images were obtained following ultrasound guided biopsy of right breast mass 7 o'clock position. Site 1: Right breast mass 7 o'clock position 1 cm from the nipple: Ribbon clip: In appropriate position. Site 2: Right breast mass 7 o'clock position 2 cm from the nipple: Coil clip: In appropriate position. IMPRESSION: Appropriate positioning of the biopsy marking clips as above. Final Assessment: Post Procedure Mammograms for Marker Placement Electronically Signed   By: Lovey Newcomer M.D.   On: 02/06/2023 13:32  [place] RT BREAST BX W LOC DEV 1ST LESION IMG BX SPEC US GUIDE  Result Date: 02/06/2023 CLINICAL DATA:  Patient with indeterminate right breast masses. EXAM: ULTRASOUND GUIDED RIGHT BREAST CORE NEEDLE BIOPSY COMPARISON:  Previous exam(s). PROCEDURE: I met with the  patient and we discussed the procedure of ultrasound-guided biopsy, including benefits and alternatives. We discussed the high likelihood of a successful procedure. We discussed the risks of the procedure, including infection, bleeding, tissue injury, clip migration, and inadequate sampling. Informed written consent was given. The usual time-out protocol was performed immediately prior to the procedure. Site 1: Right breast mass 7 o'clock position 1 cm from the nipple Lesion quadrant: Lower outer quadrant Using sterile technique and 1% Lidocaine as local anesthetic, under direct ultrasound visualization, a 14 gauge spring-loaded device was used to perform biopsy of right breast mass 7 o'clock position 1 cm from the nipple using a lateral approach. At the conclusion of the procedure ribbon shaped tissue marker clip was deployed into the biopsy cavity. Follow up 2 view mammogram was performed and dictated separately. Site 2: Right breast mass 7 o'clock position 2 cm from the nipple Lesion quadrant: Lower outer quadrant Using sterile technique and 1% Lidocaine as local anesthetic, under direct ultrasound visualization, a 14 gauge spring-loaded device was used to perform biopsy of right breast mass 7 o'clock position 2 cm from the nipple using a lateral approach. At the conclusion of the procedure coil shaped tissue marker clip was deployed into the biopsy cavity. Follow up 2 view mammogram was performed and dictated separately. IMPRESSION: Ultrasound guided biopsy of right breast masses 7 o'clock position 1 cm and 2 cm from the nipple as above. No apparent complications. Electronically Signed   By: Lovey Newcomer M.D.   On: 02/06/2023 13:15  [place] RT BREAST BX W LOC DEV EA ADD LESION IMG BX SPEC US GUIDE  Result Date: 02/06/2023 CLINICAL DATA:  Patient with indeterminate right breast masses. EXAM: ULTRASOUND GUIDED RIGHT BREAST CORE  NEEDLE BIOPSY COMPARISON:  Previous exam(s). PROCEDURE: I met with the patient and we  discussed the procedure of ultrasound-guided biopsy, including benefits and alternatives. We discussed the high likelihood of a successful procedure. We discussed the risks of the procedure, including infection, bleeding, tissue injury, clip migration, and inadequate sampling. Informed written consent was given. The usual time-out protocol was performed immediately prior to the procedure. Site 1: Right breast mass 7 o'clock position 1 cm from the nipple Lesion quadrant: Lower outer quadrant Using sterile technique and 1% Lidocaine as local anesthetic, under direct ultrasound visualization, a 14 gauge spring-loaded device was used to perform biopsy of right breast mass 7 o'clock position 1 cm from the nipple using a lateral approach. At the conclusion of the procedure ribbon shaped tissue marker clip was deployed into the biopsy cavity. Follow up 2 view mammogram was performed and dictated separately. Site 2: Right breast mass 7 o'clock position 2 cm from the nipple Lesion quadrant: Lower outer quadrant Using sterile technique and 1% Lidocaine as local anesthetic, under direct ultrasound visualization, a 14 gauge spring-loaded device was used to perform biopsy of right breast mass 7 o'clock position 2 cm from the nipple using a lateral approach. At the conclusion of the procedure coil shaped tissue marker clip was deployed into the biopsy cavity. Follow up 2 view mammogram was performed and dictated separately. IMPRESSION: Ultrasound guided biopsy of right breast masses 7 o'clock position 1 cm and 2 cm from the nipple as above. No apparent complications. Electronically Signed   By: Lovey Newcomer M.D.   On: 02/06/2023 13:15  MM DIAG BREAST TOMO BILATERAL  Result Date: 01/28/2023 CLINICAL DATA:  Patient presents for spontaneous rust colored discharge from the right nipple. EXAM: DIGITAL DIAGNOSTIC BILATERAL MAMMOGRAM WITH TOMOSYNTHESIS; ULTRASOUND RIGHT BREAST LIMITED TECHNIQUE: Bilateral digital diagnostic  mammography and breast tomosynthesis was performed.; Targeted ultrasound examination of the right breast was performed COMPARISON:  Previous exam(s). ACR Breast Density Category c: The breasts are heterogeneously dense, which may obscure small masses. FINDINGS: Interval development of ductal dilatation and obscured mass within the inferior retroareolar right breast. No additional masses, calcifications or distortion identified within either breast. On physical exam, there is a small palpable mass retroareolar right breast Targeted ultrasound is performed, showing multiple dilated ducts within the retroareolar right breast. There is a 1.6 x 1.5 x 0.8 cm intraductal mass right breast 7 o'clock position 2 cm from nipple. Additionally there is a 0.7 x 0.8 x 0.5 cm intraductal mass right breast 7 o'clock position 1 cm from the nipple. No right axillary adenopathy. IMPRESSION: There are two indeterminate intraductal masses within the right breast, potentially papillomas. Alternative etiologies are not excluded. RECOMMENDATION: Ultrasound-guided core needle biopsy of both right breast masses 7 o'clock position 2 cm from nipple and 7 o'clock position 1 cm from the nipple. After diagnosis, consider bilateral breast MRI for further evaluation. I have discussed the findings and recommendations with the patient. If applicable, a reminder letter will be sent to the patient regarding the next appointment. BI-RADS CATEGORY  4: Suspicious. Electronically Signed   By: Lovey Newcomer M.D.   On: 01/28/2023 11:52  US BREAST LTD UNI RIGHT INC AXILLA  Result Date: 01/28/2023 CLINICAL DATA:  Patient presents for spontaneous rust colored discharge from the right nipple. EXAM: DIGITAL DIAGNOSTIC BILATERAL MAMMOGRAM WITH TOMOSYNTHESIS; ULTRASOUND RIGHT BREAST LIMITED TECHNIQUE: Bilateral digital diagnostic mammography and breast tomosynthesis was performed.; Targeted ultrasound examination of the right breast was performed COMPARISON:   Previous  exam(s). ACR Breast Density Category c: The breasts are heterogeneously dense, which may obscure small masses. FINDINGS: Interval development of ductal dilatation and obscured mass within the inferior retroareolar right breast. No additional masses, calcifications or distortion identified within either breast. On physical exam, there is a small palpable mass retroareolar right breast Targeted ultrasound is performed, showing multiple dilated ducts within the retroareolar right breast. There is a 1.6 x 1.5 x 0.8 cm intraductal mass right breast 7 o'clock position 2 cm from nipple. Additionally there is a 0.7 x 0.8 x 0.5 cm intraductal mass right breast 7 o'clock position 1 cm from the nipple. No right axillary adenopathy. IMPRESSION: There are two indeterminate intraductal masses within the right breast, potentially papillomas. Alternative etiologies are not excluded. RECOMMENDATION: Ultrasound-guided core needle biopsy of both right breast masses 7 o'clock position 2 cm from nipple and 7 o'clock position 1 cm from the nipple. After diagnosis, consider bilateral breast MRI for further evaluation. I have discussed the findings and recommendations with the patient. If applicable, a reminder letter will be sent to the patient regarding the next appointment. BI-RADS CATEGORY  4: Suspicious. Electronically Signed   By: Lovey Newcomer M.D.   On: 01/28/2023 11:52      IMPRESSION/PLAN: 1. *** Dr. Lisbeth Renshaw discussed the pathology findings and reviewed the nature of ***. The consensus from the breast conference includes ***. Dr. Lisbeth Renshaw recommends external beam radiotherapy to the breast following her ***lumpectomy to reduce risks of local recurrence followed by antiestrogen therapy. We discussed the risks, benefits, short, and long term effects of radiotherapy, as well as the *** intent, and the patient is interested in proceeding. Dr. Lisbeth Renshaw discussed the delivery and logistics of radiotherapy and anticipates a course  of *** weeks of radiotherapy to the *** breast with *** deep inspiration breath hold technique. We will see her back a few weeks after surgery to discuss the simulation process and anticipate starting radiotherapy about 4-6 weeks after surgery.   2. Possible genetic predisposition to malignancy. The patient is a candidate for genetic testing given *** personal and family history. She will meet with our geneticist today in clinic.   In a visit lasting *** minutes, greater than 50% of the time was spent face to face reviewing her case, as well as in preparation of, discussing, and coordinating the patient's care.  The above documentation reflects my direct findings during this shared patient visit. Please see the separate note by Dr. Lisbeth Renshaw on this date for the remainder of the patient's plan of care.    Leona Singleton, [place]    **Disclaimer: This note was dictated with voice recognition software. Similar sounding words can inadvertently be transcribed and this note may contain transcription errors which may not have been corrected upon publication of note.**

## 2023-02-11 ENCOUNTER — Encounter: Payer: Self-pay | Admitting: *Deleted

## 2023-02-11 DIAGNOSIS — C50511 Malignant neoplasm of lower-outer quadrant of right female breast: Secondary | ICD-10-CM | POA: Insufficient documentation

## 2023-02-11 DIAGNOSIS — Z171 Estrogen receptor negative status [ER-]: Secondary | ICD-10-CM

## 2023-02-12 ENCOUNTER — Inpatient Hospital Stay (HOSPITAL_BASED_OUTPATIENT_CLINIC_OR_DEPARTMENT_OTHER): Payer: Medicare PPO | Admitting: Hematology and Oncology

## 2023-02-12 ENCOUNTER — Encounter: Payer: Self-pay | Admitting: *Deleted

## 2023-02-12 ENCOUNTER — Other Ambulatory Visit: Payer: Self-pay

## 2023-02-12 ENCOUNTER — Ambulatory Visit: Payer: Medicare PPO | Attending: General Surgery | Admitting: Physical Therapy

## 2023-02-12 ENCOUNTER — Encounter: Payer: Self-pay | Admitting: Physical Therapy

## 2023-02-12 ENCOUNTER — Inpatient Hospital Stay (HOSPITAL_BASED_OUTPATIENT_CLINIC_OR_DEPARTMENT_OTHER): Payer: Medicare PPO | Admitting: Genetic Counselor

## 2023-02-12 ENCOUNTER — Ambulatory Visit
Admission: RE | Admit: 2023-02-12 | Discharge: 2023-02-12 | Disposition: A | Discharge status: Home / Self Care | Payer: Medicare PPO | Source: Ambulatory Visit | Attending: Radiation Oncology | Admitting: Radiation Oncology

## 2023-02-12 ENCOUNTER — Inpatient Hospital Stay: Payer: Medicare PPO | Attending: Hematology and Oncology

## 2023-02-12 ENCOUNTER — Inpatient Hospital Stay: Payer: Medicare PPO | Admitting: Licensed Clinical Social Worker

## 2023-02-12 ENCOUNTER — Ambulatory Visit: Payer: Self-pay | Admitting: General Surgery

## 2023-02-12 VITALS — BP 134/49 | HR 89 | Temp 98.6°F | Resp 16 | Ht 62.0 in | Wt 138.4 lb

## 2023-02-12 DIAGNOSIS — C50511 Malignant neoplasm of lower-outer quadrant of right female breast: Secondary | ICD-10-CM

## 2023-02-12 DIAGNOSIS — G8929 Other chronic pain: Secondary | ICD-10-CM

## 2023-02-12 DIAGNOSIS — M25511 Pain in right shoulder: Secondary | ICD-10-CM | POA: Insufficient documentation

## 2023-02-12 DIAGNOSIS — Z79899 Other long term (current) drug therapy: Secondary | ICD-10-CM | POA: Diagnosis not present

## 2023-02-12 DIAGNOSIS — Z171 Estrogen receptor negative status [ER-]: Secondary | ICD-10-CM

## 2023-02-12 DIAGNOSIS — Z87891 Personal history of nicotine dependence: Secondary | ICD-10-CM | POA: Diagnosis not present

## 2023-02-12 DIAGNOSIS — C50919 Malignant neoplasm of unspecified site of unspecified female breast: Secondary | ICD-10-CM | POA: Diagnosis not present

## 2023-02-12 DIAGNOSIS — R293 Abnormal posture: Secondary | ICD-10-CM

## 2023-02-12 DIAGNOSIS — I10 Essential (primary) hypertension: Secondary | ICD-10-CM | POA: Insufficient documentation

## 2023-02-12 DIAGNOSIS — M25611 Stiffness of right shoulder, not elsewhere classified: Secondary | ICD-10-CM

## 2023-02-12 LAB — CBC WITH DIFFERENTIAL (CANCER CENTER ONLY)
Abs Immature Granulocytes: 0.02 10*3/uL (ref 0.00–0.07)
Basophils Absolute: 0.1 10*3/uL (ref 0.0–0.1)
Basophils Relative: 1 %
Eosinophils Absolute: 0.2 10*3/uL (ref 0.0–0.5)
Eosinophils Relative: 2 %
HCT: 36.2 % (ref 36.0–46.0)
Hemoglobin: 12.6 g/dL (ref 12.0–15.0)
Immature Granulocytes: 0 %
Lymphocytes Relative: 17 %
Lymphs Abs: 1.4 10*3/uL (ref 0.7–4.0)
MCH: 30.4 pg (ref 26.0–34.0)
MCHC: 34.8 g/dL (ref 30.0–36.0)
MCV: 87.4 fL (ref 80.0–100.0)
Monocytes Absolute: 0.6 10*3/uL (ref 0.1–1.0)
Monocytes Relative: 8 %
Neutro Abs: 6 10*3/uL (ref 1.7–7.7)
Neutrophils Relative %: 72 %
Platelet Count: 371 10*3/uL (ref 150–400)
RBC: 4.14 MIL/uL (ref 3.87–5.11)
RDW: 12.8 % (ref 11.5–15.5)
WBC Count: 8.3 10*3/uL (ref 4.0–10.5)
nRBC: 0 % (ref 0.0–0.2)

## 2023-02-12 LAB — CMP (CANCER CENTER ONLY)
ALT: 14 U/L (ref 0–44)
AST: 20 U/L (ref 15–41)
Albumin: 4.5 g/dL (ref 3.5–5.0)
Alkaline Phosphatase: 61 U/L (ref 38–126)
Anion gap: 11 (ref 5–15)
BUN: 24 mg/dL — ABNORMAL HIGH (ref 8–23)
CO2: 24 mmol/L (ref 22–32)
Calcium: 10.3 mg/dL (ref 8.9–10.3)
Chloride: 104 mmol/L (ref 98–111)
Creatinine: 1.09 mg/dL — ABNORMAL HIGH (ref 0.44–1.00)
GFR, Estimated: 52 mL/min — ABNORMAL LOW (ref >60)
Glucose, Bld: 197 mg/dL — ABNORMAL HIGH (ref 70–99)
Potassium: 4 mmol/L (ref 3.5–5.1)
Sodium: 139 mmol/L (ref 135–145)
Total Bilirubin: 0.3 mg/dL (ref 0.3–1.2)
Total Protein: 7.8 g/dL (ref 6.5–8.1)

## 2023-02-12 LAB — GENETIC SCREENING ORDER

## 2023-02-12 MED ORDER — KETOROLAC TROMETHAMINE 15 MG/ML IJ SOLN: 15.0000 mg | Freq: Once | INTRAMUSCULAR | Status: AC

## 2023-02-12 NOTE — Progress Notes (Signed)
[place] [facility] NOTE  Patient Care Team: Rogers Blocker, MD as PCP - General (Internal Medicine) Benay Pike, MD as Consulting Physician (Hematology and Oncology) Kyung Rudd, MD as Consulting Physician (Radiation Oncology) [name], MD as Consulting Physician (General Surgery) Mauro Kaufmann, RN as Oncology Nurse Navigator Rockwell Germany, RN as Oncology Nurse Navigator  CHIEF COMPLAINTS/PURPOSE OF CONSULTATION:  Newly diagnosed breast cancer  HISTORY OF PRESENTING ILLNESS:  Deanna Rose 77 y.o. female is here because of recent diagnosis of right breast.  I reviewed her records extensively and collaborated the history with the patient.  SUMMARY OF ONCOLOGIC HISTORY: Oncology History  Malignant neoplasm of lower-outer quadrant of right breast of female, estrogen receptor negative ([place])  01/28/2023 Mammogram   Bilateral diagnostic mammogram showed 2 indeterminate intraductal masses within the right breast potentially papillomas.  Alternative etiologies are not excluded.  Ultrasound-guided core needle biopsy of both right breast mass at 7 o'clock position 2 cm from the nipple and 7 o'clock position 1 cm from the nipple was recommended.   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  Similar pathology noted at right breast needle core biopsy 7:00 2 cm from the nipple.  Prognostics showed ER 0% negative PR 0% negative and Ki-67 of 60% from the first biopsy at 7:00 1 cm from the nipple.  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  She arrived to the appointment today with her son and her daughter.  She states she is overwhelmed, she understands there is breast cancer in her right breast.  She has noticed that his right breast bloody nipple discharge for the past several months, could not tell an exact timeline.  At baseline she is not very active, she spends most of her day sitting.,  Walks around in [place] etc. she cannot remember a very active thing she did in the past several months.  She is able to drive although she chooses not to drive for far.  She is independent with her activities of daily living.  Her son and her daughter helps her with most of the chores.  Besides the nipple discharge, she denies any active symptoms.  Rest of the pertinent review of systems reviewed and negative  MEDICAL HISTORY:  Past Medical History:  Diagnosis Date   Arthritis    Asthma    Blood clot in vein    leg   1966   Cancer ([place])    UTERUS   1970S   GERD (gastroesophageal reflux disease)    Headache    Heart murmur    History of depression    without worsening   History of hiatal hernia    History of sickle cell trait    Hypertension    stable   Low back pain    with degenerative disc disease by MRI   Mild obesity    Right flank pain    radicular   Stroke ([place])    many years ago  1980s, left facial twitching takes botox     SURGICAL HISTORY: Past Surgical  History:  Procedure Laterality Date   BILATERAL CARPAL TUNNEL RELEASE     BREAST BIOPSY Right 02/06/2023   [place] RT BREAST BX W LOC DEV 1ST LESION IMG BX SPEC US GUIDE 02/06/2023 [facility]   BREAST BIOPSY Right 02/06/2023   [place] RT BREAST BX W LOC DEV EA ADD LESION IMG BX SPEC US GUIDE 02/06/2023 [facility]   BREAST EXCISIONAL BIOPSY Left 1970   BREAST SURGERY     bil breast bx    2x each   GLAUCOMA SURGERY     LEG SURGERY     rt leg blood clot removed 1966  [facility]   UTERINE FIBROID  SURGERY     CANCER REMOVED    SOCIAL HISTORY: Social History   Socioeconomic History   Marital status: Divorced    Spouse name: Not on file   Number of children: 2   Years of education: Not on file   Highest education level: Not on file  Occupational History   Not on file  Tobacco Use   Smoking status: Former    Packs/day: 0.25    Years: 12.00    Additional pack years: 0.00    Total pack years: 3.00    Types: Cigarettes    Quit date: 1970    Years since quitting: 54.2   Smokeless tobacco: Never  Vaping Use   Vaping Use: Never used  Substance and Sexual Activity   Alcohol use: No   Drug use: No   Sexual activity: Not on file  Other Topics Concern   Not on file  Social History Narrative   Not on file   Social Determinants of Health   Financial Resource Strain: Not on file  Food Insecurity: Not on file  Transportation Needs: Not on file  Physical Activity: Not on file  Stress: Not on file  Social Connections: Not on file  Intimate Partner Violence: Not on file    FAMILY HISTORY: Family History  Problem Relation Age of Onset   Heart disease Mother    Hypertension Mother    Heart disease Father    Hypertension Father    Hypertension Sister    Heart disease Sister     ALLERGIES:  is allergic to sulfa antibiotics.  MEDICATIONS:  Current Outpatient Medications  Medication Sig Dispense Refill   acetaminophen (TYLENOL) 500 MG tablet Take 1,000 mg by mouth every 8 (eight) hours as needed for mild pain or moderate pain.     amiloride-hydrochlorothiazide (MODURETIC) 5-50 MG tablet Take 1 tablet by mouth daily.     amLODipine (NORVASC) 5 MG tablet Take 5 mg by mouth daily.     b complex vitamins tablet Take 1 tablet by mouth daily.     baclofen (LIORESAL) 10 MG tablet Take 10 mg by mouth 3 (three) times daily as needed for muscle spasms.      Calcium Carb-Cholecalciferol (CALCIUM + D3) 600-200 MG-UNIT TABS Take 1 tablet by mouth daily.     citalopram (CELEXA) 20  MG tablet Take 20 mg by mouth at bedtime.      fluticasone (FLOVENT HFA) 110 MCG/ACT inhaler Inhale 1 puff into the lungs 2 (two) times daily as needed.     gabapentin (NEURONTIN) 300 MG capsule Take 300 mg by mouth 2 (two) times daily.      magnesium oxide (MAG-OX) 400 (240 Mg) MG tablet Take 1 tablet by mouth 2 (two) times daily.     memantine (NAMENDA) 10 MG tablet Take 10 mg by mouth  at bedtime.     metoCLOPramide (REGLAN) 10 MG tablet Take 10 mg by mouth daily.     montelukast (SINGULAIR) 10 MG tablet Take 10 mg by mouth daily.     Multiple Vitamins-Minerals (CENTRUM SILVER PO) Take 1 tablet by mouth daily.      omeprazole (PRILOSEC) 40 MG capsule Take 40 mg by mouth daily.     rosuvastatin (CRESTOR) 5 MG tablet Take 5 mg by mouth at bedtime.     vitamin C (ASCORBIC ACID) 500 MG tablet Take 500 mg by mouth daily.     vitamin E 400 UNIT capsule Take 400 Units by mouth daily.     No current facility-administered medications for this visit.    REVIEW OF SYSTEMS:   Constitutional: Denies fevers, chills or abnormal night sweats Eyes: Denies blurriness of vision, double vision or watery eyes Ears, nose, mouth, throat, and face: Denies mucositis or sore throat Respiratory: Denies cough, dyspnea or wheezes Cardiovascular: Denies palpitation, chest discomfort or lower extremity swelling Gastrointestinal:  Denies nausea, heartburn or change in bowel habits Skin: Denies abnormal skin rashes Lymphatics: Denies new lymphadenopathy or easy bruising Neurological:Denies numbness, tingling or new weaknesses Behavioral/Psych: Mood is stable, no new changes  Breast: Denies any palpable lumps or discharge All other systems were reviewed with the patient and are negative.  PHYSICAL EXAMINATION: ECOG PERFORMANCE STATUS: 2 - Symptomatic, <50% confined to bed  Vitals:   02/12/23 0838  BP: (!) 134/49  Pulse: 89  Resp: 16  Temp: 98.6 F (37 C)  SpO2: 100%   Filed Weights   02/12/23 0838   Weight: 138 lb 6.4 oz (62.8 kg)    GENERAL:alert, no distress and comfortable Neck: No palpable cervical adenopathy  BREAST: Bloody nipple discharge noted in the right breast.  Palpable retroareolar breast mass noted.  Both the masses are not distinguishable on exam today.  No palpable regional adenopathy.  LABORATORY DATA:  I have reviewed the data as listed Lab Results  Component Value Date   WBC 8.3 02/12/2023   HGB 12.6 02/12/2023   HCT 36.2 02/12/2023   MCV 87.4 02/12/2023   PLT 371 02/12/2023   Lab Results  Component Value Date   NA 139 02/12/2023   K 4.0 02/12/2023   CL 104 02/12/2023   CO2 24 02/12/2023    RADIOGRAPHIC STUDIES: I have personally reviewed the radiological reports and agreed with the findings in the report.  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  She appears to have at least 2 areas of abnormal findings on imaging.  We have reviewed the imaging findings and pathology results in detail today.  We have also reviewed this in the breast cancer multidisciplinary conference.  Given her age, despite she being high-grade and triple negative, it may be reasonable to proceed with upfront surgery followed by consideration for adjuvant chemotherapy.  We have also discussed about adjuvant chemotherapy with docetaxel and cyclophosphamide every 21 days for 4 cycles versus CMF for adjuvant therapy for about 6 cycles. We have discussed about mechanism of action of chemo, adverse effects of chemo including but not limited to fatigue, nausea, vomiting, increased risk of infections or diarrhea, neuropathy etc. in  her case given her  sedentary lifestyle and advanced age, although CMF is inferior in efficacy to [facility], it may be reasonable to proceed with adjuvant CMF.  I have given both options to them.  They would like to think about it and let us know about their treatment plan.  There is no additional role for antiestrogens given ER/PR negative disease after completing local therapy.  She will return to clinic after surgery to review any additional recommendations.  Thank you for consulting [place] in the care of this patient.  Please do not hesitate to contact us with any additional questions or concerns.  Total time spent: 60 minutes including history, physical exam, review of records, counseling and coordination of care. All questions were answered. The patient knows to call the clinic with any problems, questions or concerns.    Benay Pike, MD 02/12/23

## 2023-02-12 NOTE — Assessment & Plan Note (Addendum)
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  She appears to have at least 2 areas of abnormal findings on imaging.  We have reviewed the imaging findings and pathology results in detail today.  We have also reviewed this in the breast cancer multidisciplinary conference.  Given her age, despite she being high-grade and triple negative, it may be reasonable to proceed with upfront surgery followed by consideration for adjuvant chemotherapy.  We have also discussed about adjuvant chemotherapy with docetaxel and cyclophosphamide every 21 days for 4 cycles versus CMF for adjuvant therapy for about 6 cycles. We have discussed about mechanism of action of chemo, adverse effects of chemo including but not limited to fatigue, nausea, vomiting, increased risk of infections or diarrhea, neuropathy etc. in her case given her sedentary lifestyle and advanced age, although CMF is inferior in efficacy to [facility], it may be reasonable to proceed with adjuvant CMF.  I have given both options to them.  They would like to think about it and let us know about their treatment plan.  There is no additional role for antiestrogens given ER/PR negative disease after completing local therapy.  She will return to clinic after surgery to review any additional recommendations.  Thank you for consulting [place] in the care of this patient.  Please do not hesitate to contact us with any additional questions or concerns.

## 2023-02-12 NOTE — Research (Signed)
[employer] 2021-05 - Specimen Collection Study to Evaluate Biomarkers in Subjects with Cancer   Patient Deanna Rose was identified by Dr Chryl Heck as a potential candidate for the above listed study.  This Clinical Research Nurse met with MIRI TURNIER, [id number], on 02/12/23 in a manner and location that ensures patient privacy to discuss participation in the above listed research study.  Patient is Accompanied by family member .  A copy of the informed consent document with embedded HIPAA language was provided to the patient.  Patient reads, speaks, and understands [place].   Patient was provided with the business card of this Nurse and encouraged to contact the research team with any questions.  Approximately 15 minutes were spent with the patient reviewing the informed consent documents.  Patient was provided the option of taking informed consent documents home to review and was encouraged to review at their convenience with their support network, including other care providers. Patient took the consent documents home to review.  Plan made for follow up phone call the afternoon of Monday 02/17/23. Confirmed phone number [phone].  Marjie Skiff Keianna Signer, RN, BSN, [facility] [place] [facility]  Her  Hers Clinical Research Nurse [facility] Direct Dial [phone]  Pager [phone] 02/12/2023 1:17 PM

## 2023-02-12 NOTE — Therapy (Addendum)
OUTPATIENT PHYSICAL THERAPY BREAST CANCER BASELINE EVALUATION   Patient Name: Deanna Rose MRN: [id number] DOB:Feb 15, 1946, 77 y.o., female Today's Date: 02/12/2023  END OF SESSION:  PT End of Session - 02/12/23 0922     Visit Number 1    Number of Visits 2    Date for PT Re-Evaluation 04/09/23    PT Start Time 37    PT Stop Time 1039   Also saw pt from 1100 to 1130 for a total of 49 minutes   PT Time Calculation (min) 19 min    Activity Tolerance Patient tolerated treatment well    Behavior During Therapy WFL for tasks assessed/performed             Past Medical History:  Diagnosis Date   Arthritis    Asthma    Blood clot in vein    leg   1966   Cancer ([place])    UTERUS   1970S   GERD (gastroesophageal reflux disease)    Headache    Heart murmur    History of depression    without worsening   History of hiatal hernia    History of sickle cell trait    Hypertension    stable   Low back pain    with degenerative disc disease by MRI   Mild obesity    Right flank pain    radicular   Stroke ([place])    many years ago  1980s, left facial twitching takes botox    Past Surgical History:  Procedure Laterality Date   BILATERAL CARPAL TUNNEL RELEASE     BREAST BIOPSY Right 02/06/2023   [place] RT BREAST BX W LOC DEV 1ST LESION IMG BX [place] US GUIDE 02/06/2023 [facility]   BREAST BIOPSY Right 02/06/2023   [place] RT BREAST BX W LOC DEV EA ADD LESION IMG BX SPEC US GUIDE 02/06/2023 [facility]   BREAST EXCISIONAL BIOPSY Left 1970   BREAST SURGERY     bil breast bx    2x each   GLAUCOMA SURGERY     LEG SURGERY     rt leg blood clot removed [place]     CANCER REMOVED   Patient Active Problem List   Diagnosis Date Noted   Malignant neoplasm of lower-outer quadrant of right breast of female, estrogen receptor negative ([place]) 02/11/2023   LVH (left ventricular hypertrophy) 07/30/2022   Primary hypertension 07/30/2022   Coronary  artery calcification seen on CAT scan 07/30/2022   Atelectasis 11/01/2021   Asthma 11/01/2021   Lumbar stenosis with neurogenic claudication 03/07/2016   Low back pain    Right flank pain    Hypertension    History of depression     REFERRING PROVIDER: Dr. Autumn Messing  REFERRING DIAG: Right breast cancer  THERAPY DIAG:  Malignant neoplasm of lower-outer quadrant of right breast of female, estrogen receptor negative ([place])  Abnormal posture  Chronic right shoulder pain  Stiffness of right shoulder, not elsewhere classified  Rationale for Evaluation and Treatment: Rehabilitation  ONSET DATE: 01/28/2023  SUBJECTIVE:  SUBJECTIVE STATEMENT: Patient reports she is here today to be seen by her medical team for her newly diagnosed right breast cancer. She also c/o right shoulder pain.  PERTINENT HISTORY:  Patient was diagnosed on 01/28/2023 with right grade 3 invasive ductal carcinoma breast cancer. It measures 8 mm and 1.6 cm and is located in the lower outer quadrant. It is triple negative with a Ki67 of 60%. She has a history of uterine cancer from the 1970's. Her right shoulder pain began more than a year ago and she has not seen a doctor for this.  PATIENT GOALS:   reduce lymphedema risk and learn post op HEP.   PAIN:  Are you having pain? Yes: NPRS scale: 7/10 Pain location: right shoulder Pain description: aching and throbbing Aggravating factors: Using arm and reaching Relieving factors: heat  PRECAUTIONS: Active CA Fall - ambulates with SPC  HAND DOMINANCE: right  WEIGHT BEARING RESTRICTIONS: No  FALLS:  Has patient fallen in last 6 months? No but appear to be at risk; pt wants to focus on shoulder pain and breast cancer and does not want to address balance right now.  LIVING  ENVIRONMENT: Patient lives with: herself Lives in: House/apartment Has following equipment at home: Single point cane  OCCUPATION: retired Pharmacist, hospital  LEISURE: She does not exercise  PRIOR LEVEL OF FUNCTION: Independent   OBJECTIVE:  COGNITION: Overall cognitive status: Within functional limits for tasks assessed    POSTURE:  Forward head and rounded shoulders posture  PALPATION:  Patient is tender to palpation right posterior shoulder and intrascapular region on the right side. Right scapula with poor mobility and sits protracted due to posture.   UPPER EXTREMITY AROM/PROM:  A/PROM RIGHT   eval   Shoulder extension 65  Shoulder flexion 101 and painful  Shoulder abduction 140  Shoulder internal rotation 28 and painful  Shoulder external rotation 72    (Blank rows = not tested)  A/PROM LEFT   eval  Shoulder extension 58  Shoulder flexion 136  Shoulder abduction 138  Shoulder internal rotation 64  Shoulder external rotation 68    (Blank rows = not tested)  CERVICAL AROM: All within normal limits:    Percent limited  Flexion WNL  Extension WNL  Right lateral flexion WNL  Left lateral flexion WNL  Right rotation 25% limited  Left rotation 25% limited    UPPER EXTREMITY STRENGTH: Not tested due to pain but she is functional  LYMPHEDEMA ASSESSMENTS:   LANDMARK RIGHT   eval  10 cm proximal to olecranon process 28.2  Olecranon process 23.4  10 cm proximal to ulnar styloid process 22  Just proximal to ulnar styloid process 15.6  Across hand at thumb web space 18.4  At base of 2nd digit 5.8  (Blank rows = not tested)  LANDMARK LEFT   eval  10 cm proximal to olecranon process 28.1  Olecranon process 23.1  10 cm proximal to ulnar styloid process 21.1  Just proximal to ulnar styloid process 15.8  Across hand at thumb web space 18  At base of 2nd digit 5.8  (Blank rows = not tested)  L-DEX LYMPHEDEMA SCREENING:  The patient was assessed using the L-Dex  machine today to produce a lymphedema index baseline score. The patient will be reassessed on a regular basis (typically every 3 months) to obtain new L-Dex scores. If the score is > 6.5 points away from his/her baseline score indicating onset of subclinical lymphedema, it will be recommended to wear a  compression garment for 4 weeks, 12 hours per day and then be reassessed. If the score continues to be > 6.5 points from baseline at reassessment, we will initiate lymphedema treatment. Assessing in this manner has a 95% rate of preventing clinically significant lymphedema.   L-DEX FLOWSHEETS - 02/12/23 0900       L-DEX LYMPHEDEMA SCREENING   Measurement Type Unilateral    L-DEX MEASUREMENT EXTREMITY Upper Extremity    POSITION  Standing    DOMINANT SIDE Right    At Risk Side Right    BASELINE SCORE (UNILATERAL) 6.7             QUICK DASH SURVEY:  Katina Dung - 02/12/23 0001     Open a tight or new jar Moderate difficulty    Do heavy household chores (wash walls, wash floors) No difficulty    Carry a shopping bag or briefcase No difficulty    Wash your back Mild difficulty    Use a knife to cut food Mild difficulty    Recreational activities in which you take some force or impact through your arm, shoulder, or hand (golf, hammering, tennis) Mild difficulty    During the past week, to what extent has your arm, shoulder or hand problem interfered with your normal social activities with family, friends, neighbors, or groups? Slightly    During the past week, to what extent has your arm, shoulder or hand problem limited your work or other regular daily activities Not at all    Arm, shoulder, or hand pain. Mild    Tingling (pins and needles) in your arm, shoulder, or hand None    Difficulty Sleeping Mild difficulty    DASH Score 18.18 %              PATIENT EDUCATION:  Education details: Lymphedema risk reduction and post op shoulder/posture HEP Person educated: Patient Education  method: Explanation, Demonstration, Handout Education comprehension: Patient verbalized understanding and returned demonstration  HOME EXERCISE PROGRAM: Patient was instructed today in a home exercise program today for post op shoulder range of motion. These included active assist shoulder flexion in sitting, scapular retraction, wall walking with shoulder abduction, and hands behind head external rotation.  She was encouraged to do these twice a day, holding 3 seconds and repeating 5 times when permitted by her physician.   ASSESSMENT:  CLINICAL IMPRESSION: Patient was diagnosed on 01/28/2023 with right grade 3 invasive ductal carcinoma breast cancer. It measures 8 mm and 1.6 cm and is located in the lower outer quadrant. It is triple negative with a Ki67 of 60%. She has a history of uterine cancer from the 1970's.Her multidisciplinary medical team met prior to her assessments to determine a recommended treatment plan. She is planning to have a right lumpectomy and sentinel node biopsy followed by possible chemotherapy and radiation. She will benefit from a post op PT reassessment to determine needs and from L-Dex screens every 3 months for 2 years to detect subclinical lymphedema. She will benefit from pre-op PT to improve right shoulder pain and ROM for improved surgical outcomes.  Pt will benefit from skilled therapeutic intervention to improve on the following deficits: Decreased knowledge of precautions, impaired UE functional use, pain, decreased ROM, postural dysfunction.   PT treatment/interventions: ADL/self-care home management, pt/family education, therapeutic exercise, manual therapy, dry needling, moist heat and ice (prior to surgery as pt will be at risk for lymphedema post op).  REHAB POTENTIAL: Good  CLINICAL DECISION MAKING: Stable/uncomplicated  EVALUATION  COMPLEXITY: Low   GOALS: Goals reviewed with patient? YES  SHORT TERM GOALS:    Name Target Date Goal status  1 Pt  will be able to verbalize understanding of pertinent lymphedema risk reduction practices relevant to her dx specifically related to skin care.  Baseline:  No knowledge 02/12/2023 Achieved at eval  2 Pt will be able to return demo and/or verbalize understanding of the post op HEP related to regaining shoulder ROM. Baseline:  No knowledge 02/12/2023 Achieved at eval  3 Pt will be able to verbalize understanding of the importance of attending the post op After Breast CA Class for further lymphedema risk reduction education and therapeutic exercise.  Baseline:  No knowledge 02/12/2023 Achieved at eval  4 Patient will report >/= 25% reduction in pain in her right shoulder to better tolerate radiation positioning. 03/12/2023   5 Patient will improve her DASH score to be </= 10 for improved overall UE function. 03/12/2023    LONG TERM GOALS:    1 Pt will demo she has regained full shoulder ROM and function post operatively compared to baselines.  Baseline: See objective measurements taken today. 04/09/2023     PLAN:  PT FREQUENCY/DURATION: EVAL and 1 follow up appointment for breast cancer. Will see 2/week for up to 4 weeks pre-operatively   PLAN FOR NEXT SESSION: Begin PROM, AAROM exercises; consider dry needling suscapularis, lats, and right shoulder area. OK to use heat/ice prior to surgery.  Will reassess 3-4 weeks post op to determine needs related to breast cancer.   Patient will follow up at outpatient cancer rehab 3-4 weeks following surgery.  If the patient requires physical therapy at that time, a specific plan will be dictated and sent to the referring physician for approval. The patient was educated today on appropriate basic range of motion exercises to begin post operatively and the importance of attending the After Breast Cancer class following surgery.  Patient was educated today on lymphedema risk reduction practices as it pertains to recommendations that will benefit the patient immediately  following surgery.  She verbalized good understanding.    Physical Therapy Information for After Breast Cancer Surgery/Treatment:  Lymphedema is a swelling condition that you may be at risk for in your arm if you have lymph nodes removed from the armpit area.  After a sentinel node biopsy, the risk is approximately 5-9% and is higher after an axillary node dissection.  There is treatment available for this condition and it is not life-threatening.  Contact your physician or physical therapist with concerns. You may begin the 4 shoulder/posture exercises (see additional sheet) when permitted by your physician (typically a week after surgery).  If you have drains, you may need to wait until those are removed before beginning range of motion exercises.  A general recommendation is to not lift your arms above shoulder height until drains are removed.  These exercises should be done to your tolerance and gently.  This is not a "no pain/no gain" type of recovery so listen to your body and stretch into the range of motion that you can tolerate, stopping if you have pain.  If you are having immediate reconstruction, ask your plastic surgeon about doing exercises as he or she may want you to wait. We encourage you to attend the free one time ABC (After Breast Cancer) class offered by [facility].  You will learn information related to lymphedema risk, prevention and treatment and additional exercises to regain mobility following  surgery.  You can call [phone] for more information.  This is offered the 1st and 3rd Monday of each month.  You only attend the class one time. While undergoing any medical procedure or treatment, try to avoid blood pressure being taken or needle sticks from occurring on the arm on the side of cancer.   This recommendation begins after surgery and continues for the rest of your life.  This may help reduce your risk of getting lymphedema (swelling in your arm). An  excellent resource for those seeking information on lymphedema is the National Lymphedema Network's web site. It can be accessed at [place].org If you notice swelling in your hand, arm or breast at any time following surgery (even if it is many years from now), please contact your doctor or physical therapist to discuss this.  Lymphedema can be treated at any time but it is easier for you if it is treated early on.  If you feel like your shoulder motion is not returning to normal in a reasonable amount of time, please contact your surgeon or physical therapist.  [facility] [phone]. [address], [place] [address]  ABC CLASS After Breast Cancer Class  After Breast Cancer Class is a specially designed exercise class to assist you in a safe recover after having breast cancer surgery.  In this class you will learn how to get back to full function whether your drains were just removed or if you had surgery a month ago.  This one-time class is held the 1st and 3rd Monday of every month from 11:00 a.m. until 12:00 noon virtually.  This class is FREE and space is limited. For more information or to register for the next available class, call [phone].  Class Goals  Understand specific stretches to improve the flexibility of you chest and shoulder. Learn ways to safely strengthen your upper body and improve your posture. Understand the warning signs of infection and why you may be at risk for an arm infection. Learn about Lymphedema and prevention.  ** You do not attend this class until after surgery.  Drains must be removed to participate  Patient was instructed today in a home exercise program today for post op shoulder range of motion. These included active assist shoulder flexion in sitting, scapular retraction, wall walking with shoulder abduction, and hands behind head external rotation.  She was encouraged to do these twice a day,  holding 3 seconds and repeating 5 times when permitted by her physician.  Annia Friendly, [place] 02/12/23 12:16 PM

## 2023-02-12 NOTE — Progress Notes (Signed)
[place] Work  Initial Assessment   Deanna Rose is a 77 y.o. year old female accompanied by son, Ion Pate and daughter, Blanchie Dessert. Clinical Social Work was referred by  [facility]  for assessment of psychosocial needs.   SDOH (Social Determinants of Health) assessments performed: Yes SDOH Interventions    Flowsheet Row Clinical Support from 02/12/2023 in [place] [facility]: Lambert Rhonda W  SDOH Interventions   Food Insecurity Interventions Intervention Not Indicated  Housing Interventions Intervention Not Indicated  Transportation Interventions Intervention Not Indicated  Utilities Interventions Intervention Not Indicated       SDOH Screenings   Food Insecurity: No Food Insecurity (02/12/2023)  Housing: Low Risk  (02/12/2023)  Transportation Needs: No Transportation Needs (02/12/2023)  Utilities: Not At Risk (02/12/2023)  Tobacco Use: Medium Risk (02/12/2023)     Distress Screen completed: Yes    02/12/2023    1:36 PM  ONCBCN DISTRESS SCREENING  Screening Type Initial Screening  Emotional problem type Depression;Nervousness/Anxiety;Adjusting to illness;Boredom  Information Concerns Type Lack of info about diagnosis  Physical Problem type Nausea/vomiting;Sleep/insomnia;Breathing      Family/Social Information:  Housing Arrangement: patient lives alone. Her son Licensed conveyancer) & daughter Toney Rakes) visit often and assist with chores, groceries, and managing finances Family members/support persons in your life? Family Transportation concerns: no  Employment: Retired .  Income source: Paediatric nurse concerns: No Type of concern: None Food access concerns: no Religious or spiritual practice: Not known Services Currently in place:  [facility] [facility]  Coping/ Adjustment to diagnosis: Patient understands treatment plan and what happens next? Feels more informed after meeting with medical team. Her adult children will help her remember  and process the information from today Patient reported stressors: Anxiety/ nervousness and Adjusting to my illness Patient enjoys time with family/ friends, including her 3 granddaughters (21, 33, 8) Current coping skills/ strengths: Supportive family/friends     SUMMARY: Current SDOH Barriers:  No major barriers noted today  Clinical Social Work Clinical Goal(s):  No clinical social work goals at this time  Interventions: Discussed common feeling and emotions when being diagnosed with cancer, and the importance of support during treatment Informed patient of the support team roles and support services at [facility] Provided [facility] contact information and encouraged patient to call with any questions or concerns   Follow Up Plan: Patient will contact CSW with any support or resource needs Patient verbalizes understanding of plan: Yes    Nephi Savage E Reinhart Saulters, LCSW

## 2023-02-13 ENCOUNTER — Encounter: Payer: Self-pay | Admitting: Genetic Counselor

## 2023-02-13 ENCOUNTER — Ambulatory Visit: Payer: Medicare PPO | Admitting: Physical Therapy

## 2023-02-13 ENCOUNTER — Other Ambulatory Visit: Payer: Self-pay | Admitting: General Surgery

## 2023-02-13 DIAGNOSIS — C50511 Malignant neoplasm of lower-outer quadrant of right female breast: Secondary | ICD-10-CM

## 2023-02-13 DIAGNOSIS — M25511 Pain in right shoulder: Secondary | ICD-10-CM | POA: Diagnosis not present

## 2023-02-13 DIAGNOSIS — G8929 Other chronic pain: Secondary | ICD-10-CM

## 2023-02-13 DIAGNOSIS — M25611 Stiffness of right shoulder, not elsewhere classified: Secondary | ICD-10-CM

## 2023-02-13 NOTE — Progress Notes (Signed)
REFERRING PROVIDER: Benay Pike, MD  PRIMARY PROVIDER:  Rogers Blocker, MD  PRIMARY REASON FOR VISIT:  1. Malignant neoplasm of lower-outer quadrant of right breast of female, estrogen receptor negative ([place])     HISTORY OF PRESENT ILLNESS:   Ms. Jungmann, a 77 y.o. female, was seen for a [facility] cancer genetics consultation at the request of Dr. Chryl Heck due to a personal history of cancer.  Ms. Cobos presents to clinic today to discuss the possibility of a hereditary predisposition to cancer, to discuss genetic testing, and to further clarify her future cancer risks, as well as potential cancer risks for family members.   In March 2024, at the age of 67, Ms. 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   CANCER HISTORY:  Oncology History  Malignant neoplasm of lower-outer quadrant of right breast of female, estrogen receptor negative ([place])  01/28/2023 Mammogram   Bilateral diagnostic mammogram showed 2 indeterminate intraductal masses within the right breast potentially papillomas.  Alternative etiologies are not excluded.  Ultrasound-guided core needle biopsy of both right breast mass at 7 o'clock position 2 cm from the nipple and 7 o'clock position 1 cm from the nipple was recommended.   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  Similar pathology noted at right breast needle core biopsy 7:00 2 cm from the nipple.  Prognostics showed ER 0% negative PR 0% negative and Ki-67 of 60% from the first biopsy at 7:00 1 cm from the nipple.  HER2 was 1+ negative   02/11/2023 Initial Diagnosis   Malignant neoplasm of lower-outer quadrant of right breast of female, estrogen receptor negative ([place])     Past Medical History:   Diagnosis Date   Arthritis    Asthma    Blood clot in vein    leg   1966   Cancer ([place])    UTERUS   1970S   GERD (gastroesophageal reflux disease)    Headache    Heart murmur    History of depression    without worsening   History of hiatal hernia    History of sickle cell trait    Hypertension    stable   Low back pain    with degenerative disc disease by MRI   Mild obesity    Right flank pain    radicular   Stroke ([place])    many years ago  1980s, left facial twitching takes botox     Past Surgical History:  Procedure Laterality Date   BILATERAL CARPAL TUNNEL RELEASE     BREAST BIOPSY Right 02/06/2023   [place] RT BREAST BX W LOC DEV 1ST LESION IMG BX [place] US GUIDE 02/06/2023 [facility]   BREAST BIOPSY Right 02/06/2023   [place] RT BREAST BX W LOC DEV EA ADD LESION IMG BX SPEC US GUIDE 02/06/2023 [facility]   BREAST EXCISIONAL BIOPSY Left 1970   BREAST SURGERY     bil breast bx    2x each   GLAUCOMA SURGERY     LEG SURGERY     rt leg blood clot removed 1966  [facility]   UTERINE FIBROID SURGERY     CANCER REMOVED    Social History   Socioeconomic History   Marital status: Divorced    Spouse name: Not on file   Number  of children: 2   Years of education: Not on file   Highest education level: Not on file  Occupational History   Not on file  Tobacco Use   Smoking status: Former    Packs/day: 0.25    Years: 12.00    Additional pack years: 0.00    Total pack years: 3.00    Types: Cigarettes    Quit date: 17    Years since quitting: 54.2   Smokeless tobacco: Never  Vaping Use   Vaping Use: Never used  Substance and Sexual Activity   Alcohol use: No   Drug use: No   Sexual activity: Not on file  Other Topics Concern   Not on file  Social History Narrative   Not on file   Social Determinants of Health   Financial Resource Strain: Not on file  Food Insecurity: No Food Insecurity (02/12/2023)   Hunger Vital Sign    Worried About Running Out of  Food in the Last Year: Never true    Ran Out of Food in the Last Year: Never true  Transportation Needs: No Transportation Needs (02/12/2023)   PRAPARE - Hydrologist (Medical): No    Lack of Transportation (Non-Medical): No  Physical Activity: Not on file  Stress: Not on file  Social Connections: Not on file     FAMILY HISTORY:  We obtained a detailed, 4-generation family history.  Significant diagnoses are listed below: Family History  Problem Relation Age of Onset   Heart disease Mother    Hypertension Mother    Heart disease Father    Hypertension Father    Hypertension Sister    Heart disease Sister    Cancer Sister        unknown type   Thyroid cancer Daughter 65   Multiple myeloma Son 73      Ms. Cincotta's daughter was diagnosed with thyroid cancer at age 6 and her son was diagnosed with multiple myeloma at age 7. She has two sisters and one sister was diagnosed with an unknown type of cancer, she is deceased. Ms. Hodde is unaware of previous family history of genetic testing for hereditary cancer risks. There is no reported Ashkenazi Jewish ancestry.   GENETIC COUNSELING ASSESSMENT: Ms. Moench is a 77 y.o. female with a personal and family history of cancer which is somewhat suggestive of a hereditary predisposition to cancer given her triple negative breast cancer. We, therefore, discussed and recommended the following at today's visit.   DISCUSSION: We discussed that 5 - 10% of cancer is hereditary, with most cases of breast cancer associated with BRCA1/2.  There are other genes that can be associated with hereditary breast cancer syndromes.  We discussed that testing is beneficial for several reasons including knowing how to follow individuals after completing their treatment, identifying whether potential treatment options would be beneficial, and understanding if other family members could be at risk for cancer and allowing  them to undergo genetic testing.   We reviewed the characteristics, features and inheritance patterns of hereditary cancer syndromes. We also discussed genetic testing, including the appropriate family members to test, the process of testing, insurance coverage and turn-around-time for results. We discussed the implications of a negative, positive, carrier and/or variant of uncertain significant result. We recommended Ms. Poynor pursue genetic testing for a panel that includes genes associated with breast and thyroid cancer.   Based on Ms. Bordenave's personal and family history of cancer, she meets medical criteria  for genetic testing. Despite that she meets criteria, she may still have an out of pocket cost. We discussed that if her out of pocket cost for testing is over $100, the laboratory will call and confirm whether she wants to proceed with testing.  If the out of pocket cost of testing is less than $100 she will be billed by the genetic testing laboratory.   PLAN: Despite our recommendation, Ms. Sessums did not wish to pursue genetic testing at today's visit. We understand this decision and remain available to coordinate genetic testing at any time in the future. We, therefore, recommend Ms. Kozlowski continue to follow the cancer screening guidelines given by her primary healthcare provider.  Ms. Brookshire questions were answered to her satisfaction today. Our contact information was provided should additional questions or concerns arise. Thank you for the referral and allowing [place] to share in the care of your patient.   Lucille Passy, MS, [facility] Genetic Counselor [email or web address] (P) [phone]  The patient was seen for a total of <15 minutes in face-to-face genetic counseling.  The patient brought her daughter and son. Drs. Lindi Adie and/or Burr Medico were available to discuss this case as needed.    _______________________________________________________________________ For Office Staff:  Number of people involved in session: 3 Was an Intern/ student involved with case: no

## 2023-02-13 NOTE — Therapy (Signed)
OUTPATIENT PHYSICAL THERAPY BREAST CANCER BASELINE PROGRESS NOTE   Patient Name: Deanna Rose MRN: [id number] DOB:Mar 30, 1946, 77 y.o., female Today's Date: 02/13/2023  END OF SESSION:  PT End of Session - 02/13/23 1619     Visit Number 2    Date for PT Re-Evaluation 04/09/23    Authorization Type [employer] 9 visits until 5/22    PT Start Time 1617    PT Stop Time 1655    PT Time Calculation (min) 38 min    Activity Tolerance Patient tolerated treatment well             Past Medical History:  Diagnosis Date   Arthritis    Asthma    Blood clot in vein    leg   1966   Cancer ([place])    UTERUS   1970S   GERD (gastroesophageal reflux disease)    Headache    Heart murmur    History of depression    without worsening   History of hiatal hernia    History of sickle cell trait    Hypertension    stable   Low back pain    with degenerative disc disease by MRI   Mild obesity    Right flank pain    radicular   Stroke ([place])    many years ago  1980s, left facial twitching takes botox    Past Surgical History:  Procedure Laterality Date   BILATERAL CARPAL TUNNEL RELEASE     BREAST BIOPSY Right 02/06/2023   [place] RT BREAST BX W LOC DEV 1ST LESION IMG BX [place] US GUIDE 02/06/2023 [facility]   BREAST BIOPSY Right 02/06/2023   [place] RT BREAST BX W LOC DEV EA ADD LESION IMG BX SPEC US GUIDE 02/06/2023 [facility]   BREAST EXCISIONAL BIOPSY Left 1970   BREAST SURGERY     bil breast bx    2x each   GLAUCOMA SURGERY     LEG SURGERY     rt leg blood clot removed [place]     CANCER REMOVED   Patient Active Problem List   Diagnosis Date Noted   Malignant neoplasm of lower-outer quadrant of right breast of female, estrogen receptor negative ([place]) 02/11/2023   LVH (left ventricular hypertrophy) 07/30/2022   Primary hypertension 07/30/2022   Coronary artery calcification seen on CAT scan 07/30/2022   Atelectasis 11/01/2021   Asthma  11/01/2021   Lumbar stenosis with neurogenic claudication 03/07/2016   Low back pain    Right flank pain    Hypertension    History of depression     REFERRING PROVIDER: Dr. Autumn Messing  REFERRING DIAG: Right breast cancer  THERAPY DIAG:  Malignant neoplasm of lower-outer quadrant of right breast of female, estrogen receptor negative ([place])  Chronic right shoulder pain  Stiffness of right shoulder, not elsewhere classified  Rationale for Evaluation and Treatment: Rehabilitation  ONSET DATE: 01/28/2023  SUBJECTIVE:  SUBJECTIVE STATEMENT: The patient reports some right shoulder pain today.  Sometimes hurts even at rest.  I use my hot water bottle on it.  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 It measures 8 mm and 1.6 cm and is located in the lower outer quadrant. It is triple negative with a Ki67 of 60%. She has a history of uterine cancer from the 1970's. Her right shoulder pain began more than a year ago and she has not seen a doctor for this. Surgery scheduled for 4/29  PATIENT GOALS:   reduce lymphedema risk and learn post op HEP.   PAIN:  Are you having pain? Yes: NPRS scale: 6/10 Pain location: right shoulder Pain description: aching and throbbing Aggravating factors: Using arm and reaching Relieving factors: heat  PRECAUTIONS: Active CA Fall - ambulates with SPC  HAND DOMINANCE: right  WEIGHT BEARING RESTRICTIONS: No  FALLS:  Has patient fallen in last 6 months? No but appear to be at risk; pt wants to focus on shoulder pain and breast cancer and does not want to address balance right now.  LIVING ENVIRONMENT: Patient lives with:  herself Lives in: House/apartment Has following equipment at home: Single point cane  OCCUPATION: retired Pharmacist, hospital  LEISURE: She does not exercise  PRIOR LEVEL OF FUNCTION: Independent   OBJECTIVE:  COGNITION: Overall cognitive status: Within functional limits for tasks assessed    POSTURE:  Forward head and rounded shoulders posture  PALPATION:  Patient is tender to palpation right posterior shoulder and intrascapular region on the right side. Right scapula with poor mobility and sits protracted due to posture.   UPPER EXTREMITY AROM/PROM:  A/PROM RIGHT   eval   Shoulder extension 65  Shoulder flexion 101 and painful  Shoulder abduction 140  Shoulder internal rotation 28 and painful  Shoulder external rotation 72    (Blank rows = not tested)  A/PROM LEFT   eval  Shoulder extension 58  Shoulder flexion 136  Shoulder abduction 138  Shoulder internal rotation 64  Shoulder external rotation 68    (Blank rows = not tested)  CERVICAL AROM: All within normal limits:    Percent limited  Flexion WNL  Extension WNL  Right lateral flexion WNL  Left lateral flexion WNL  Right rotation 25% limited  Left rotation 25% limited    UPPER EXTREMITY STRENGTH: Not tested due to pain but she is functional  LYMPHEDEMA ASSESSMENTS:   LANDMARK RIGHT   eval  10 cm proximal to olecranon process 28.2  Olecranon process 23.4  10 cm proximal to ulnar styloid process 22  Just proximal to ulnar styloid process 15.6  Across hand at thumb web space 18.4  At base of 2nd digit 5.8  (Blank rows = not tested)  LANDMARK LEFT   eval  10 cm proximal to olecranon process 28.1  Olecranon process 23.1  10 cm proximal to ulnar styloid process 21.1  Just proximal to ulnar styloid process 15.8  Across hand at thumb web space 18  At base of 2nd digit 5.8  (Blank rows = not tested)  L-DEX LYMPHEDEMA SCREENING:  The patient was assessed using the L-Dex machine today to produce a  lymphedema index baseline score. The patient will be reassessed on a regular basis (typically every 3 months) to obtain new L-Dex scores. If the score is > 6.5 points away from his/her baseline score  indicating onset of subclinical lymphedema, it will be recommended to wear a compression garment for 4 weeks, 12 hours per day and then be reassessed. If the score continues to be > 6.5 points from baseline at reassessment, we will initiate lymphedema treatment. Assessing in this manner has a 95% rate of preventing clinically significant lymphedema.     QUICK DASH SURVEY:     PATIENT EDUCATION:  Education details: Lymphedema risk reduction and post op shoulder/posture HEP Person educated: Patient Education method: Explanation, Demonstration, Handout Education comprehension: Patient verbalized understanding and returned demonstration  HOME EXERCISE PROGRAM: Patient was instructed today in a home exercise program today for post op shoulder range of motion. These included active assist shoulder flexion in sitting, scapular retraction, wall walking with shoulder abduction, and hands behind head external rotation.  She was encouraged to do these twice a day, holding 3 seconds and repeating 5 times when permitted by her physician. Access Code: [email or web address] URL: [email or web address] Date: 02/13/2023 Prepared by: Ruben Im  Exercises - Supine Shoulder Flexion AAROM with Dowel  - 2 x daily - 7 x weekly - 1 sets - 10 reps - Supine Shoulder Horizontal Abduction Adduction AAROM with Dowel  - 2 x daily - 7 x weekly - 1 sets - 10 reps - Seated Elbow Flexion Extension AAROM with Dowel into Wall (Mirrored)  - 2 x daily - 7 x weekly - 1 sets - 10 reps - Seated Thoracic Lumbar Extension with Pectoralis Stretch  - 2 x daily - 7 x weekly - 1 sets - 10 reps - Seated Shoulder Flexion Towel Slide at Table Top  - 2 x daily - 7 x weekly - 1 sets - 10 reps - Seated Scapular Retraction  - 1 x daily - 7  x weekly - 1 sets - 10 reps  Treatment:  DATE: 3/28: Supine passive ROM with heat: flexion, abduction, external rotation 15x each Supine cane press 5x (painful) Supine cane flexion to forehead 15x Supine cane horizontal abduction 15x Seated cane forward/back press per HEP 15x Seated table slides flexion 15x Seated thoracic extension with towel roll as fulcrum hands behind neck 15x Seated scapular retraction 15x    ASSESSMENT:  CLINICAL IMPRESSION: Initiated ROM ex's to improve right shoulder pain and ROM for improved surgical outcomes.  Well tolerated with addition of moist heat in conjunction with ex.  Empty/pain endfeel with passive ROM rather than capsular tightness.  Therapist closely monitoring response and providing verbal cues to optimize exercise effectiveness.     Pt will benefit from skilled therapeutic intervention to improve on the following deficits: Decreased knowledge of precautions, impaired UE functional use, pain, decreased ROM, postural dysfunction.   PT treatment/interventions: ADL/self-care home management, pt/family education, therapeutic exercise, manual therapy, dry needling, moist heat and ice (prior to surgery as pt will be at risk for lymphedema post op).  REHAB POTENTIAL: Good  CLINICAL DECISION MAKING: Stable/uncomplicated  EVALUATION COMPLEXITY: Low   GOALS: Goals reviewed with patient? YES  SHORT TERM GOALS:    Name Target Date Goal status  1 Pt will be able to verbalize understanding of pertinent lymphedema risk reduction practices relevant to her dx specifically related to skin care.  Baseline:  No knowledge 02/13/2023 Achieved at eval  2 Pt will be able to return demo and/or verbalize understanding of the post op HEP related to regaining shoulder ROM. Baseline:  No knowledge 02/13/2023 Achieved at eval  3 Pt will be able to verbalize understanding of the importance of attending  the post op After Breast CA Class for further lymphedema risk  reduction education and therapeutic exercise.  Baseline:  No knowledge 02/13/2023 Achieved at eval  4 Patient will report >/= 25% reduction in pain in her right shoulder to better tolerate radiation positioning. 03/12/2023   5 Patient will improve her DASH score to be </= 10 for improved overall UE function. 03/12/2023    LONG TERM GOALS:    1 Pt will demo she has regained full shoulder ROM and function post operatively compared to baselines.  Baseline: See objective measurements taken today. 04/09/2023     PLAN:  PT FREQUENCY/DURATION: EVAL and 1 follow up appointment for breast cancer. Will see 2/week for up to 4 weeks pre-operatively   PLAN FOR NEXT SESSION: Begin PROM, AAROM exercises; consider dry needling suscapularis, lats, and right shoulder area. OK to use heat/ice prior to surgery.  Will reassess 3-4 weeks post op to determine needs related to breast cancer.   Patient will follow up at outpatient cancer rehab 3-4 weeks following surgery.  If the patient requires physical therapy at that time, a specific plan will be dictated and sent to the referring physician for approval. The patient was educated today on appropriate basic range of motion exercises to begin post operatively and the importance of attending the After Breast Cancer class following surgery.  Patient was educated today on lymphedema risk reduction practices as it pertains to recommendations that will benefit the patient immediately following surgery.  She verbalized good understanding.    Physical Therapy Information for After Breast Cancer Surgery/Treatment:  Lymphedema is a swelling condition that you may be at risk for in your arm if you have lymph nodes removed from the armpit area.  After a sentinel node biopsy, the risk is approximately 5-9% and is higher after an axillary node dissection.  There is treatment available for this condition and it is not life-threatening.  Contact your physician or physical therapist with  concerns. You may begin the 4 shoulder/posture exercises (see additional sheet) when permitted by your physician (typically a week after surgery).  If you have drains, you may need to wait until those are removed before beginning range of motion exercises.  A general recommendation is to not lift your arms above shoulder height until drains are removed.  These exercises should be done to your tolerance and gently.  This is not a "no pain/no gain" type of recovery so listen to your body and stretch into the range of motion that you can tolerate, stopping if you have pain.  If you are having immediate reconstruction, ask your plastic surgeon about doing exercises as he or she may want you to wait. We encourage you to attend the free one time ABC (After Breast Cancer) class offered by [facility].  You will learn information related to lymphedema risk, prevention and treatment and additional exercises to regain mobility following surgery.  You can call [phone] for more information.  This is offered the 1st and 3rd Monday of each month.  You only attend the class one time. While undergoing any medical procedure or treatment, try to avoid blood pressure being taken or needle sticks from occurring on the arm on the side of cancer.   This recommendation begins after surgery and continues for the rest of your life.  This may help reduce your risk of getting lymphedema (swelling in your arm). An excellent resource for those seeking information on lymphedema is the National Lymphedema Network's  web site. It can be accessed at [place].org If you notice swelling in your hand, arm or breast at any time following surgery (even if it is many years from now), please contact your doctor or physical therapist to discuss this.  Lymphedema can be treated at any time but it is easier for you if it is treated early on.  If you feel like your shoulder motion is not returning to normal in a reasonable  amount of time, please contact your surgeon or physical therapist.  [facility] [phone]. [address],  [place] [address]  ABC CLASS After Breast Cancer Class  After Breast Cancer Class is a specially designed exercise class to assist you in a safe recover after having breast cancer surgery.  In this class you will learn how to get back to full function whether your drains were just removed or if you had surgery a month ago.  This one-time class is held the 1st and 3rd Monday of every month from 11:00 a.m. until 12:00 noon virtually.  This class is FREE and space is limited. For more information or to register for the next available class, call [phone].  Class Goals  Understand specific stretches to improve the flexibility of you chest and shoulder. Learn ways to safely strengthen your upper body and improve your posture. Understand the warning signs of infection and why you may be at risk for an arm infection. Learn about Lymphedema and prevention.  ** You do not attend this class until after surgery.  Drains must be removed to participate  Patient was instructed today in a home exercise program today for post op shoulder range of motion. These included active assist shoulder flexion in sitting, scapular retraction, wall walking with shoulder abduction, and hands behind head external rotation.  She was encouraged to do these twice a day, holding 3 seconds and repeating 5 times when permitted by her physician. Ruben Im, PT 02/13/23 5:12 PM Phone: [phone] Fax: [phone]

## 2023-02-17 ENCOUNTER — Telehealth: Payer: Self-pay

## 2023-02-17 NOTE — Telephone Encounter (Signed)
[employer] 2021-05 - Specimen Collection Study to Evaluate Biomarkers in Subjects with Cancer   Spoke with patient to follow up from [facility] last week. She feels overwhelmed with everything going on and does not wish to pursue study enrollment.  Marjie Skiff Kenyah Luba, RN, BSN, [facility]  Her  [facility] Clinical Research Nurse [facility] Direct Dial [phone]  Pager [phone] 02/17/2023 3:30 PM

## 2023-02-18 ENCOUNTER — Telehealth: Payer: Self-pay | Admitting: Hematology and Oncology

## 2023-02-18 NOTE — Telephone Encounter (Signed)
Spoke with patient confirming upcoming appointment  

## 2023-02-19 ENCOUNTER — Encounter: Payer: Self-pay | Admitting: Physical Therapy

## 2023-02-19 ENCOUNTER — Ambulatory Visit: Payer: Medicare PPO | Attending: General Surgery | Admitting: Physical Therapy

## 2023-02-19 DIAGNOSIS — G8929 Other chronic pain: Secondary | ICD-10-CM

## 2023-02-19 DIAGNOSIS — M25511 Pain in right shoulder: Secondary | ICD-10-CM | POA: Diagnosis present

## 2023-02-19 DIAGNOSIS — Z171 Estrogen receptor negative status [ER-]: Secondary | ICD-10-CM

## 2023-02-19 DIAGNOSIS — M25611 Stiffness of right shoulder, not elsewhere classified: Secondary | ICD-10-CM

## 2023-02-19 DIAGNOSIS — C50511 Malignant neoplasm of lower-outer quadrant of right female breast: Secondary | ICD-10-CM | POA: Insufficient documentation

## 2023-02-19 DIAGNOSIS — R293 Abnormal posture: Secondary | ICD-10-CM

## 2023-02-19 NOTE — Therapy (Signed)
OUTPATIENT PHYSICAL THERAPY BREAST CANCER BASELINE PROGRESS NOTE   Patient Name: Deanna Rose MRN: [id number] DOB:May 10, 1946, 77 y.o., female Today's Date: 02/19/2023  END OF SESSION:    Past Medical History:  Diagnosis Date   Arthritis    Asthma    Blood clot in vein    leg   1966   Cancer    UTERUS   1970S   GERD (gastroesophageal reflux disease)    Headache    Heart murmur    History of depression    without worsening   History of hiatal hernia    History of sickle cell trait    Hypertension    stable   Low back pain    with degenerative disc disease by MRI   Mild obesity    Right flank pain    radicular   Stroke    many years ago  1980s, left facial twitching takes botox    Past Surgical History:  Procedure Laterality Date   BILATERAL CARPAL TUNNEL RELEASE     BREAST BIOPSY Right 02/06/2023   [place] RT BREAST BX W LOC DEV 1ST LESION IMG BX [place] US GUIDE 02/06/2023 [facility]   BREAST BIOPSY Right 02/06/2023   [place] RT BREAST BX W LOC DEV EA ADD LESION IMG BX SPEC US GUIDE 02/06/2023 [facility]   BREAST EXCISIONAL BIOPSY Left 1970   BREAST SURGERY     bil breast bx    2x each   GLAUCOMA SURGERY     LEG SURGERY     rt leg blood clot removed [place]   Patient Active Problem List   Diagnosis Date Noted   Malignant neoplasm of lower-outer quadrant of right breast of female, estrogen receptor negative 02/11/2023   LVH (left ventricular hypertrophy) 07/30/2022   Primary hypertension 07/30/2022   Coronary artery calcification seen on CAT scan 07/30/2022   Atelectasis 11/01/2021   Asthma 11/01/2021   Lumbar stenosis with neurogenic claudication 03/07/2016   Low back pain    Right flank pain    Hypertension    History of depression     REFERRING PROVIDER: Dr. Autumn Messing  REFERRING DIAG: Right breast cancer  THERAPY DIAG:  No diagnosis found.  Rationale for Evaluation and Treatment:  Rehabilitation  ONSET DATE: 01/28/2023  SUBJECTIVE:                                                                                                                                                                                           SUBJECTIVE STATEMENT: Pt says her arm is  OK , but she rates it 7/10 . She says the hot pack and the exercise for last week helped a lot.  She is trying to do the exericises at home, but she can't remember exactly how they went.   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 It measures 8 mm and 1.6 cm and is located in the lower outer quadrant. It is triple negative with a Ki67 of 60%. She has a history of uterine cancer from the 1970's. Her right shoulder pain began more than a year ago and she has not seen a doctor for this. Surgery scheduled for 4/29  PATIENT GOALS:   reduce lymphedema risk and learn post op HEP.   PAIN:  Are you having pain? Yes, 7/10   PRECAUTIONS: Active CA Fall - ambulates with SPC  HAND DOMINANCE: right  WEIGHT BEARING RESTRICTIONS: No  FALLS:  Has patient fallen in last 6 months? No but appear to be at risk; pt wants to focus on shoulder pain and breast cancer and does not want to address balance right now.  LIVING ENVIRONMENT: Patient lives with: herself Lives in: House/apartment Has following equipment at home: Single point cane  OCCUPATION: retired Pharmacist, hospital  LEISURE: She does not exercise  PRIOR LEVEL OF FUNCTION: Independent   OBJECTIVE:  COGNITION: Overall cognitive status: Within functional limits for tasks assessed    POSTURE:  Forward head and rounded shoulders posture  PALPATION:  Patient is tender to palpation right posterior shoulder and intrascapular region on the right side. Right scapula with poor mobility and sits protracted due to posture.   UPPER EXTREMITY  AROM/PROM:  A/PROM RIGHT   eval  RIGHT  02/19/2023  Shoulder extension 65   Shoulder flexion 101 and painful 150   Shoulder abduction 140   Shoulder internal rotation 28 and painful   Shoulder external rotation 72 85    (Blank rows = not tested)  A/PROM LEFT   eval  Shoulder extension 58  Shoulder flexion 136  Shoulder abduction 138  Shoulder internal rotation 64  Shoulder external rotation 68    (Blank rows = not tested)  CERVICAL AROM: All within normal limits:    Percent limited  Flexion WNL  Extension WNL  Right lateral flexion WNL  Left lateral flexion WNL  Right rotation 25% limited  Left rotation 25% limited    UPPER EXTREMITY STRENGTH: Not tested due to pain but she is functional  LYMPHEDEMA ASSESSMENTS:   LANDMARK RIGHT   eval  10 cm proximal to olecranon process 28.2  Olecranon process 23.4  10 cm proximal to ulnar styloid process 22  Just proximal to ulnar styloid process 15.6  Across hand at thumb web space 18.4  At base of 2nd digit 5.8  (Blank rows = not tested)  LANDMARK LEFT   eval  10 cm proximal to olecranon process 28.1  Olecranon process 23.1  10 cm proximal to ulnar styloid process 21.1  Just proximal to ulnar styloid process 15.8  Across hand at thumb web space 18  At base of 2nd digit 5.8  (Blank rows = not tested)  L-DEX LYMPHEDEMA SCREENING:  The patient was assessed using the L-Dex machine today to produce a lymphedema index baseline score. The patient will be reassessed on a regular basis (typically every 3 months) to obtain new L-Dex scores. If the score is > 6.5 points  away from his/her baseline score indicating onset of subclinical lymphedema, it will be recommended to wear a compression garment for 4 weeks, 12 hours per day and then be reassessed. If the score continues to be > 6.5 points from baseline at reassessment, we will initiate lymphedema treatment. Assessing in this manner has a 95% rate of preventing clinically  significant lymphedema.     QUICK DASH SURVEY:     PATIENT EDUCATION:  Education details: Reviewed home exercise Person educated: Patient Education method: Explanation, Demonstration, Education comprehension: Patient verbalized understanding and returned demonstration  HOME EXERCISE PROGRAM:  Reviewed exercise from 01/2823  and upgraded dowel felxion and abduction in sitting with her own cane . Also added shoulder rolls and wall flexion stretch    Patient was instructed today in a home exercise program today for post op shoulder range of motion. These included active assist shoulder flexion in sitting, scapular retraction, wall walking with shoulder abduction, and hands behind head external rotation.  She was encouraged to do these twice a day, holding 3 seconds and repeating 5 times when permitted by her physician. Access Code: [email or web address] URL: [email or web address] Date: 02/13/2023 Prepared by: Ruben Im  Exercises - Supine Shoulder Flexion AAROM with Dowel  - 2 x daily - 7 x weekly - 1 sets - 10 reps - Supine Shoulder Horizontal Abduction Adduction AAROM with Dowel  - 2 x daily - 7 x weekly - 1 sets - 10 reps - Seated Elbow Flexion Extension AAROM with Dowel into Wall (Mirrored)  - 2 x daily - 7 x weekly - 1 sets - 10 reps - Seated Thoracic Lumbar Extension with Pectoralis Stretch  - 2 x daily - 7 x weekly - 1 sets - 10 reps - Seated Shoulder Flexion Towel Slide at Table Top  - 2 x daily - 7 x weekly - 1 sets - 10 reps - Seated Scapular Retraction  - 1 x daily - 7 x weekly - 1 sets - 10 reps  Treatment:   DATE: 02/19/2023   Began with about 5 min of moist heat pack to relax tissues and prepare for STM with coco butter to right upper trap, posterior shoulder, scapular muscles in sitting. Myofascial release with prolonged pressure to tender points and subscapular area.  Pt with active right shoulder external rotation and 5 reps of isometric external rotation for  muscle activation Folowed up with neck stretch lateral rotation to left, bilateral shoulder flexion and abduction with her own cane 2 sets of 5 , "wall wash" stretch.  These exercises were printed out for her to take home  and were briefly described to daughter.  Remeasured right shoulder and pt with significant improvement already.     3/28: Supine passive ROM with heat: flexion, abduction, external rotation 15x each Supine cane press 5x (painful) Supine cane flexion to forehead 15x Supine cane horizontal abduction 15x Seated cane forward/back press per HEP 15x Seated table slides flexion 15x Seated thoracic extension with towel roll as fulcrum hands behind neck 15x Seated scapular retraction 15x    ASSESSMENT:  CLINICAL IMPRESSION: Pt is responding well to treatment of moist heat, soft tissue work and exercise and has improved right shoulder AROM compared to eval.  Home exercise was reinforced today and described to daughter. PT will benefit from continued PT prior to surgery to prepare with post op shoulder ROM recovery.    Pt will benefit from skilled therapeutic intervention to improve on the following deficits: Decreased knowledge of precautions, impaired  UE functional use, pain, decreased ROM, postural dysfunction.   PT treatment/interventions: ADL/self-care home management, pt/family education, therapeutic exercise, manual therapy, dry needling, moist heat and ice (prior to surgery as pt will be at risk for lymphedema post op).  REHAB POTENTIAL: Good  CLINICAL DECISION MAKING: Stable/uncomplicated  EVALUATION COMPLEXITY: Low   GOALS: Goals reviewed with patient? YES  SHORT TERM GOALS:    Name Target Date Goal status  1 Pt will be able to verbalize understanding of pertinent lymphedema risk reduction practices relevant to her dx specifically related to skin care.  Baseline:  No knowledge 02/19/2023 Achieved at eval  2 Pt will be able to return demo and/or verbalize  understanding of the post op HEP related to regaining shoulder ROM. Baseline:  No knowledge 02/19/2023 Achieved at eval  3 Pt will be able to verbalize understanding of the importance of attending the post op After Breast CA Class for further lymphedema risk reduction education and therapeutic exercise.  Baseline:  No knowledge 02/19/2023 Achieved at eval  4 Patient will report >/= 25% reduction in pain in her right shoulder to better tolerate radiation positioning. 03/12/2023   5 Patient will improve her DASH score to be </= 10 for improved overall UE function. 03/12/2023    LONG TERM GOALS:    1 Pt will demo she has regained full shoulder ROM and function post operatively compared to baselines.  Baseline: See objective measurements taken today. 04/09/2023     PLAN:  PT FREQUENCY/DURATION: EVAL and 1 follow up appointment for breast cancer. Will see 2/week for up to 4 weeks pre-operatively   PLAN FOR NEXT SESSION: Begin PROM, AAROM exercises; consider dry needling suscapularis, lats, and right shoulder area. OK to use heat/ice prior to surgery.  Will reassess 3-4 weeks post op to determine needs related to breast cancer.   Patient will follow up at outpatient cancer rehab 3-4 weeks following surgery.  If the patient requires physical therapy at that time, a specific plan will be dictated and sent to the referring physician for approval. The patient was educated today on appropriate basic range of motion exercises to begin post operatively and the importance of attending the After Breast Cancer class following surgery.  Patient was educated today on lymphedema risk reduction practices as it pertains to recommendations that will benefit the patient immediately following surgery.  She verbalized good understanding.    Physical Therapy Information for After Breast Cancer Surgery/Treatment:  Lymphedema is a swelling condition that you may be at risk for in your arm if you have lymph nodes removed from the  armpit area.  After a sentinel node biopsy, the risk is approximately 5-9% and is higher after an axillary node dissection.  There is treatment available for this condition and it is not life-threatening.  Contact your physician or physical therapist with concerns. You may begin the 4 shoulder/posture exercises (see additional sheet) when permitted by your physician (typically a week after surgery).  If you have drains, you may need to wait until those are removed before beginning range of motion exercises.  A general recommendation is to not lift your arms above shoulder height until drains are removed.  These exercises should be done to your tolerance and gently.  This is not a "no pain/no gain" type of recovery so listen to your body and stretch into the range of motion that you can tolerate, stopping if you have pain.  If you are having immediate reconstruction, ask your plastic surgeon about  doing exercises as he or she may want you to wait. We encourage you to attend the free one time ABC (After Breast Cancer) class offered by [facility].  You will learn information related to lymphedema risk, prevention and treatment and additional exercises to regain mobility following surgery.  You can call [phone] for more information.  This is offered the 1st and 3rd Monday of each month.  You only attend the class one time. While undergoing any medical procedure or treatment, try to avoid blood pressure being taken or needle sticks from occurring on the arm on the side of cancer.   This recommendation begins after surgery and continues for the rest of your life.  This may help reduce your risk of getting lymphedema (swelling in your arm). An excellent resource for those seeking information on lymphedema is the National Lymphedema Network's web site. It can be accessed at [place].org If you notice swelling in your hand, arm or breast at any time following surgery (even if it is many  years from now), please contact your doctor or physical therapist to discuss this.  Lymphedema can be treated at any time but it is easier for you if it is treated early on.  If you feel like your shoulder motion is not returning to normal in a reasonable amount of time, please contact your surgeon or physical therapist.  [facility] [phone]. [address], [address], [place] [address]  ABC CLASS After Breast Cancer Class  After Breast Cancer Class is a specially designed exercise class to assist you in a safe recover after having breast cancer surgery.  In this class you will learn how to get back to full function whether your drains were just removed or if you had surgery a month ago.  This one-time class is held the 1st and 3rd Monday of every month from 11:00 a.m. until 12:00 noon virtually.  This class is FREE and space is limited. For more information or to register for the next available class, call [phone].  Class Goals  Understand specific stretches to improve the flexibility of you chest and shoulder. Learn ways to safely strengthen your upper body and improve your posture. Understand the warning signs of infection and why you may be at risk for an arm infection. Learn about Lymphedema and prevention.  ** You do not attend this class until after surgery.  Drains must be removed to participate  Patient was instructed today in a home exercise program today for post op shoulder range of motion. These included active assist shoulder flexion in sitting, scapular retraction, wall walking with shoulder abduction, and hands behind head external rotation.  She was encouraged to do these twice a day, holding 3 seconds and repeating 5 times when permitted by her physician.    Donato Heinz. Owens Shark, [place]  02/19/23 9:30 AM Phone: [phone] Fax: [phone]

## 2023-02-19 NOTE — Patient Instructions (Signed)
Shoulder rolls  In sitting , with both hands on cane,  reach overhed.  Then have right hand on top of cane and push out to the side and up  Folded towel on wall, slide hand up and town, "wash the wall"  10 times each 2 times a day

## 2023-02-20 ENCOUNTER — Encounter: Payer: Self-pay | Admitting: *Deleted

## 2023-02-20 ENCOUNTER — Telehealth: Payer: Self-pay | Admitting: *Deleted

## 2023-02-20 NOTE — Telephone Encounter (Signed)
SPoke with patient to follow up from [facility] 3/27 and assess navigation needs. Patient denies any questions or concerns at this time. Encouraged her to call should anything arise. Patient verbalized understanding.

## 2023-02-21 ENCOUNTER — Ambulatory Visit
Admission: RE | Admit: 2023-02-21 | Discharge: 2023-02-21 | Disposition: A | Discharge status: Home / Self Care | Payer: Medicare PPO | Source: Ambulatory Visit | Attending: General Surgery | Admitting: General Surgery

## 2023-02-21 DIAGNOSIS — C50511 Malignant neoplasm of lower-outer quadrant of right female breast: Secondary | ICD-10-CM

## 2023-02-21 MED ORDER — GADOPICLENOL 0.5 MMOL/ML IV SOLN
6.0000 mL | Freq: Once | INTRAVENOUS | Status: AC | PRN
  Administered 2023-02-21: 6 mL via INTRAVENOUS

## 2023-02-24 ENCOUNTER — Encounter: Payer: Self-pay | Admitting: *Deleted

## 2023-02-25 ENCOUNTER — Other Ambulatory Visit: Payer: Self-pay | Admitting: General Surgery

## 2023-02-25 DIAGNOSIS — R928 Other abnormal and inconclusive findings on diagnostic imaging of breast: Secondary | ICD-10-CM

## 2023-02-26 ENCOUNTER — Ambulatory Visit: Payer: Medicare PPO | Admitting: Physical Therapy

## 2023-02-26 ENCOUNTER — Encounter: Payer: Self-pay | Admitting: General Surgery

## 2023-02-26 DIAGNOSIS — G8929 Other chronic pain: Secondary | ICD-10-CM

## 2023-02-26 DIAGNOSIS — M25611 Stiffness of right shoulder, not elsewhere classified: Secondary | ICD-10-CM

## 2023-02-26 DIAGNOSIS — Z171 Estrogen receptor negative status [ER-]: Secondary | ICD-10-CM

## 2023-02-26 DIAGNOSIS — C50511 Malignant neoplasm of lower-outer quadrant of right female breast: Secondary | ICD-10-CM | POA: Diagnosis not present

## 2023-02-26 NOTE — Therapy (Signed)
OUTPATIENT PHYSICAL THERAPY BREAST CANCER BASELINE PROGRESS NOTE   Patient Name: Deanna Rose MRN: [id number] DOB:10-Nov-1946, 77 y.o., female Today's Date: 02/26/2023  END OF SESSION:  PT End of Session - 02/26/23 1012     Visit Number 4    Number of Visits 9    Date for PT Re-Evaluation 04/09/23    Authorization Type [employer] 9 visits until 5/22    PT Start Time 1015    PT Stop Time 1055    PT Time Calculation (min) 40 min    Activity Tolerance Patient tolerated treatment well              Past Medical History:  Diagnosis Date   Arthritis    Asthma    Blood clot in vein    leg   1966   Cancer    UTERUS   1970S   GERD (gastroesophageal reflux disease)    Headache    Heart murmur    History of depression    without worsening   History of hiatal hernia    History of sickle cell trait    Hypertension    stable   Low back pain    with degenerative disc disease by MRI   Mild obesity    Right flank pain    radicular   Stroke    many years ago  1980s, left facial twitching takes botox    Past Surgical History:  Procedure Laterality Date   BILATERAL CARPAL TUNNEL RELEASE     BREAST BIOPSY Right 02/06/2023   [place] RT BREAST BX W LOC DEV 1ST LESION IMG BX SPEC US GUIDE 02/06/2023 [facility]   BREAST BIOPSY Right 02/06/2023   [place] RT BREAST BX W LOC DEV EA ADD LESION IMG BX SPEC US GUIDE 02/06/2023 [facility]   BREAST EXCISIONAL BIOPSY Left 1970   BREAST SURGERY     bil breast bx    2x each   GLAUCOMA SURGERY     LEG SURGERY     rt leg blood clot removed 1966  [facility]   UTERINE FIBROID SURGERY     CANCER REMOVED   Patient Active Problem List   Diagnosis Date Noted   Malignant neoplasm of lower-outer quadrant of right breast of female, estrogen receptor negative 02/11/2023   LVH (left ventricular hypertrophy) 07/30/2022   Primary hypertension 07/30/2022   Coronary artery calcification seen on CAT scan 07/30/2022   Atelectasis 11/01/2021    Asthma 11/01/2021   Lumbar stenosis with neurogenic claudication 03/07/2016   Low back pain    Right flank pain    Hypertension    History of depression     REFERRING PROVIDER: Dr. Chevis Pretty  REFERRING DIAG: Right breast cancer  THERAPY DIAG:  Malignant neoplasm of lower-outer quadrant of right breast of female, estrogen receptor negative  Chronic right shoulder pain  Stiffness of right shoulder, not elsewhere classified  Rationale for Evaluation and Treatment: Rehabilitation  ONSET DATE: 01/28/2023  SUBJECTIVE:  SUBJECTIVE STATEMENT: My shoulder hurts at night even with rest and with turning over.  That heat really helps my shoulder.  My nephew brought me here today.     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 It measures 8 mm and 1.6 cm and is located in the lower outer quadrant. It is triple negative with a Ki67 of 60%. She has a history of uterine cancer from the 1970's. Her right shoulder pain began more than a year ago and she has not seen a doctor for this. Surgery scheduled for 4/29  PATIENT GOALS:   reduce lymphedema risk and learn post op HEP.   PAIN:  Are you having pain? Yes, 7/10   PRECAUTIONS: Active CA Fall - ambulates with SPC  HAND DOMINANCE: right  WEIGHT BEARING RESTRICTIONS: No  FALLS:  Has patient fallen in last 6 months? No but appear to be at risk; pt wants to focus on shoulder pain and breast cancer and does not want to address balance right now.  LIVING ENVIRONMENT: Patient lives with: herself Lives in: House/apartment Has following equipment at home: Single point cane  OCCUPATION: retired Runner, broadcasting/film/video  LEISURE: She does  not exercise  PRIOR LEVEL OF FUNCTION: Independent   OBJECTIVE:  COGNITION: Overall cognitive status: Within functional limits for tasks assessed    POSTURE:  Forward head and rounded shoulders posture  PALPATION:  Patient is tender to palpation right posterior shoulder and intrascapular region on the right side. Right scapula with poor mobility and sits protracted due to posture.   UPPER EXTREMITY AROM/PROM:  A/PROM RIGHT   eval  RIGHT  02/19/2023 Right  4/10  Shoulder extension 65    Shoulder flexion 101 and painful 150  150  Shoulder abduction 140    Shoulder internal rotation 28 and painful    Shoulder external rotation 72 85     (Blank rows = not tested)  A/PROM LEFT   eval  Shoulder extension 58  Shoulder flexion 136  Shoulder abduction 138  Shoulder internal rotation 64  Shoulder external rotation 68    (Blank rows = not tested)  CERVICAL AROM: All within normal limits:    Percent limited  Flexion WNL  Extension WNL  Right lateral flexion WNL  Left lateral flexion WNL  Right rotation 25% limited  Left rotation 25% limited    UPPER EXTREMITY STRENGTH: Not tested due to pain but she is functional  LYMPHEDEMA ASSESSMENTS:   LANDMARK RIGHT   eval  10 cm proximal to olecranon process 28.2  Olecranon process 23.4  10 cm proximal to ulnar styloid process 22  Just proximal to ulnar styloid process 15.6  Across hand at thumb web space 18.4  At base of 2nd digit 5.8  (Blank rows = not tested)  LANDMARK LEFT   eval  10 cm proximal to olecranon process 28.1  Olecranon process 23.1  10 cm proximal to ulnar styloid process 21.1  Just proximal to ulnar styloid process 15.8  Across hand at thumb web space 18  At base of 2nd digit 5.8  (Blank rows = not tested)  L-DEX LYMPHEDEMA SCREENING:  The patient was assessed using the L-Dex machine today to produce a lymphedema index baseline score. The patient will be reassessed on a regular basis (typically  every 3 months) to obtain new L-Dex scores. If the score is > 6.5 points away  from his/her baseline score indicating onset of subclinical lymphedema, it will be recommended to wear a compression garment for 4 weeks, 12 hours per day and then be reassessed. If the score continues to be > 6.5 points from baseline at reassessment, we will initiate lymphedema treatment. Assessing in this manner has a 95% rate of preventing clinically significant lymphedema.     QUICK DASH SURVEY:     PATIENT EDUCATION:  Education details: Reviewed home exercise Person educated: Patient Education method: Explanation, Demonstration, Education comprehension: Patient verbalized understanding and returned demonstration  HOME EXERCISE PROGRAM:  Reviewed exercise from 01/2823  and upgraded dowel felxion and abduction in sitting with her own cane . Also added shoulder rolls and wall flexion stretch    Patient was instructed today in a home exercise program today for post op shoulder range of motion. These included active assist shoulder flexion in sitting, scapular retraction, wall walking with shoulder abduction, and hands behind head external rotation.  She was encouraged to do these twice a day, holding 3 seconds and repeating 5 times when permitted by her physician. Access Code: [email or web address] URL: [email or web address] Date: 02/13/2023 Prepared by: Lavinia Sharps  Exercises - Supine Shoulder Flexion AAROM with Dowel  - 2 x daily - 7 x weekly - 1 sets - 10 reps - Supine Shoulder Horizontal Abduction Adduction AAROM with Dowel  - 2 x daily - 7 x weekly - 1 sets - 10 reps - Seated Elbow Flexion Extension AAROM with Dowel into Wall (Mirrored)  - 2 x daily - 7 x weekly - 1 sets - 10 reps - Seated Thoracic Lumbar Extension with Pectoralis Stretch  - 2 x daily - 7 x weekly - 1 sets - 10 reps - Seated Shoulder Flexion Towel Slide at Table Top  - 2 x daily - 7 x weekly - 1 sets - 10 reps - Seated Scapular  Retraction  - 1 x daily - 7 x weekly - 1 sets - 10 reps  Treatment:  4/10:   seated ROM with heat: UE Ranger flexion and horizontal abduction 15x each Supine passive ROM with heat: flexion, abduction, external rotation 10x each Supine clasped hands elevation 10x Seated green ball roll outs 15x Standing at the wall sliding pillow case 10x Standing facing wall with arm horizontally abducted with left neck rotation 10x Finger ladder to level 27 Seated pulleys 2 min each flexion and abduction  Standing pulleys with 2# on one end: extension pulls (assisted elevation) 10x    02/19/2023   Began with about 5 min of moist heat pack to relax tissues and prepare for STM with coco butter to right upper trap, posterior shoulder, scapular muscles in sitting. Myofascial release with prolonged pressure to tender points and subscapular area.  Pt with active right shoulder external rotation and 5 reps of isometric external rotation for muscle activation Folowed up with neck stretch lateral rotation to left, bilateral shoulder flexion and abduction with her own cane 2 sets of 5 , "wall wash" stretch.  These exercises were printed out for her to take home  and were briefly described to daughter.  Remeasured right shoulder and pt with significant improvement already.     3/28: Supine passive ROM with heat: flexion, abduction, external rotation 15x each Supine cane press 5x (painful) Supine cane flexion to forehead 15x Supine cane horizontal abduction 15x Seated cane forward/back press per HEP 15x Seated table slides flexion 15x Seated thoracic extension with towel roll as fulcrum hands behind neck 15x  Seated scapular retraction 15x    ASSESSMENT:  CLINICAL IMPRESSION: Patient is much less painful than previous visits.  Although her subjective pain rating is high at a 7/10 on arrival, she is able to perform a progression of ROM ex's and reports decreased shoulder pain at the end of session.  Much  improved shoulder flexion and external rotation ROM since start of care.   Therapist monitoring response and providing verbal cues to optimize exercise effectiveness.     Pt will benefit from skilled therapeutic intervention to improve on the following deficits: Decreased knowledge of precautions, impaired UE functional use, pain, decreased ROM, postural dysfunction.   PT treatment/interventions: ADL/self-care home management, pt/family education, therapeutic exercise, manual therapy, dry needling, moist heat and ice (prior to surgery as pt will be at risk for lymphedema post op).  REHAB POTENTIAL: Good  CLINICAL DECISION MAKING: Stable/uncomplicated  EVALUATION COMPLEXITY: Low   GOALS: Goals reviewed with patient? YES  SHORT TERM GOALS:    Name Target Date Goal status  1 Pt will be able to verbalize understanding of pertinent lymphedema risk reduction practices relevant to her dx specifically related to skin care.  Baseline:  No knowledge 02/26/2023 Achieved at eval  2 Pt will be able to return demo and/or verbalize understanding of the post op HEP related to regaining shoulder ROM. Baseline:  No knowledge 02/26/2023 Achieved at eval  3 Pt will be able to verbalize understanding of the importance of attending the post op After Breast CA Class for further lymphedema risk reduction education and therapeutic exercise.  Baseline:  No knowledge 02/26/2023 Achieved at eval  4 Patient will report >/= 25% reduction in pain in her right shoulder to better tolerate radiation positioning. 03/12/2023   5 Patient will improve her DASH score to be </= 10 for improved overall UE function. 03/12/2023    LONG TERM GOALS:    1 Pt will demo she has regained full shoulder ROM and function post operatively compared to baselines.  Baseline: See objective measurements taken today. 04/09/2023     PLAN:  PT FREQUENCY/DURATION: EVAL and 1 follow up appointment for breast cancer. Will see 2/week for up to 4 weeks  pre-operatively   PLAN FOR NEXT SESSION: PROM, AAROM exercises; consider dry needling suscapularis, lats, and right shoulder area. OK to use heat/ice prior to surgery.  Surgery scheduled for 4/29; Will reassess 3-4 weeks post op to determine needs related to breast cancer.   Patient will follow up at outpatient cancer rehab 3-4 weeks following surgery.  If the patient requires physical therapy at that time, a specific plan will be dictated and sent to the referring physician for approval. The patient was educated today on appropriate basic range of motion exercises to begin post operatively and the importance of attending the After Breast Cancer class following surgery.  Patient was educated today on lymphedema risk reduction practices as it pertains to recommendations that will benefit the patient immediately following surgery.  She verbalized good understanding.    Physical Therapy Information for After Breast Cancer Surgery/Treatment:  Lymphedema is a swelling condition that you may be at risk for in your arm if you have lymph nodes removed from the armpit area.  After a sentinel node biopsy, the risk is approximately 5-9% and is higher after an axillary node dissection.  There is treatment available for this condition and it is not life-threatening.  Contact your physician or physical therapist with concerns. You may begin the 4 shoulder/posture exercises (see additional  sheet) when permitted by your physician (typically a week after surgery).  If you have drains, you may need to wait until those are removed before beginning range of motion exercises.  A general recommendation is to not lift your arms above shoulder height until drains are removed.  These exercises should be done to your tolerance and gently.  This is not a "no pain/no gain" type of recovery so listen to your body and stretch into the range of motion that you can tolerate, stopping if you have pain.  If you are having immediate  reconstruction, ask your plastic surgeon about doing exercises as he or she may want you to wait. We encourage you to attend the free one time ABC (After Breast Cancer) class offered by [facility].  You will learn information related to lymphedema risk, prevention and treatment and additional exercises to regain mobility following surgery.  You can call [phone] for more information.  This is offered the 1st and 3rd Monday of each month.  You only attend the class one time. While undergoing any medical procedure or treatment, try to avoid blood pressure being taken or needle sticks from occurring on the arm on the side of cancer.   This recommendation begins after surgery and continues for the rest of your life.  This may help reduce your risk of getting lymphedema (swelling in your arm). An excellent resource for those seeking information on lymphedema is the National Lymphedema Network's web site. It can be accessed at www.lymphnet.org If you notice swelling in your hand, arm or breast at any time following surgery (even if it is many years from now), please contact your doctor or physical therapist to discuss this.  Lymphedema can be treated at any time but it is easier for you if it is treated early on.  If you feel like your shoulder motion is not returning to normal in a reasonable amount of time, please contact your surgeon or physical therapist.  [facility] [phone]. [address], [address], [place] [address]  ABC CLASS After Breast Cancer Class  After Breast Cancer Class is a specially designed exercise class to assist you in a safe recover after having breast cancer surgery.  In this class you will learn how to get back to full function whether your drains were just removed or if you had surgery a month ago.  This one-time class is held the 1st and 3rd Monday of every month from 11:00 a.m. until 12:00 noon  virtually.  This class is FREE and space is limited. For more information or to register for the next available class, call [phone].  Class Goals  Understand specific stretches to improve the flexibility of you chest and shoulder. Learn ways to safely strengthen your upper body and improve your posture. Understand the warning signs of infection and why you may be at risk for an arm infection. Learn about Lymphedema and prevention.  ** You do not attend this class until after surgery.  Drains must be removed to participate  Patient was instructed today in a home exercise program today for post op shoulder range of motion. These included active assist shoulder flexion in sitting, scapular retraction, wall walking with shoulder abduction, and hands behind head external rotation.  She was encouraged to do these twice a day, holding 3 seconds and repeating 5 times when permitted by her physician.  Lavinia SharpsStacy Anmol Fleck, PT 02/26/23 11:10 AM Phone: [phone] Fax: [phone]

## 2023-02-28 ENCOUNTER — Ambulatory Visit: Payer: Medicare PPO | Attending: General Surgery | Admitting: Rehabilitative and Restorative Service Providers"

## 2023-02-28 ENCOUNTER — Encounter: Payer: Self-pay | Admitting: Rehabilitative and Restorative Service Providers"

## 2023-02-28 DIAGNOSIS — Z171 Estrogen receptor negative status [ER-]: Secondary | ICD-10-CM | POA: Diagnosis present

## 2023-02-28 DIAGNOSIS — G8929 Other chronic pain: Secondary | ICD-10-CM | POA: Diagnosis present

## 2023-02-28 DIAGNOSIS — M25611 Stiffness of right shoulder, not elsewhere classified: Secondary | ICD-10-CM | POA: Insufficient documentation

## 2023-02-28 DIAGNOSIS — R293 Abnormal posture: Secondary | ICD-10-CM | POA: Diagnosis present

## 2023-02-28 DIAGNOSIS — C50511 Malignant neoplasm of lower-outer quadrant of right female breast: Secondary | ICD-10-CM | POA: Diagnosis not present

## 2023-02-28 DIAGNOSIS — M25511 Pain in right shoulder: Secondary | ICD-10-CM | POA: Insufficient documentation

## 2023-02-28 NOTE — Therapy (Signed)
OUTPATIENT PHYSICAL THERAPY BREAST CANCER BASELINE PROGRESS NOTE   Patient Name: Deanna Rose MRN: [id number] DOB:1946-08-02, 77 y.o., female Today's Date: 02/28/2023  END OF SESSION:  PT End of Session - 02/28/23 1038     Visit Number 5    Date for PT Re-Evaluation 04/09/23    Authorization Type [employer] 9 visits until 5/22    Authorization - Visit Number 5    Authorization - Number of Visits 9    Progress Note Due on Visit 10    PT Start Time 1030    PT Stop Time 1115    PT Time Calculation (min) 45 min    Activity Tolerance Patient tolerated treatment well    Behavior During Therapy WFL for tasks assessed/performed              Past Medical History:  Diagnosis Date   Arthritis    Asthma    Blood clot in vein    leg   1966   Cancer    UTERUS   1970S   GERD (gastroesophageal reflux disease)    Headache    Heart murmur    History of depression    without worsening   History of hiatal hernia    History of sickle cell trait    Hypertension    stable   Low back pain    with degenerative disc disease by MRI   Mild obesity    Right flank pain    radicular   Stroke    many years ago  1980s, left facial twitching takes botox    Past Surgical History:  Procedure Laterality Date   BILATERAL CARPAL TUNNEL RELEASE     BREAST BIOPSY Right 02/06/2023   [place] RT BREAST BX W LOC DEV 1ST LESION IMG BX SPEC US GUIDE 02/06/2023 [facility]   BREAST BIOPSY Right 02/06/2023   [place] RT BREAST BX W LOC DEV EA ADD LESION IMG BX SPEC US GUIDE 02/06/2023 [facility]   BREAST EXCISIONAL BIOPSY Left 1970   BREAST SURGERY     bil breast bx    2x each   GLAUCOMA SURGERY     LEG SURGERY     rt leg blood clot removed 1966  [facility]   UTERINE FIBROID SURGERY     CANCER REMOVED   Patient Active Problem List   Diagnosis Date Noted   Malignant neoplasm of lower-outer quadrant of right breast of female, estrogen receptor negative 02/11/2023   LVH (left ventricular  hypertrophy) 07/30/2022   Primary hypertension 07/30/2022   Coronary artery calcification seen on CAT scan 07/30/2022   Atelectasis 11/01/2021   Asthma 11/01/2021   Lumbar stenosis with neurogenic claudication 03/07/2016   Low back pain    Right flank pain    Hypertension    History of depression     REFERRING PROVIDER: Dr. Chevis Pretty  REFERRING DIAG: Right breast cancer  THERAPY DIAG:  Malignant neoplasm of lower-outer quadrant of right breast of female, estrogen receptor negative  Chronic right shoulder pain  Stiffness of right shoulder, not elsewhere classified  Abnormal posture  Rationale for Evaluation and Treatment: Rehabilitation  ONSET DATE: 01/28/2023  SUBJECTIVE:  SUBJECTIVE STATEMENT: Patient reports that she is doing okay.  Pt reports compliance with HEP.   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 It measures 8 mm and 1.6 cm and is located in the lower outer quadrant. It is triple negative with a Ki67 of 60%. She has a history of uterine cancer from the 1970's. Her right shoulder pain began more than a year ago and she has not seen a doctor for this. Surgery scheduled for 4/29  PATIENT GOALS:   reduce lymphedema risk and learn post op HEP.   PAIN:  Are you having pain? Yes, 6/10   PRECAUTIONS: Active CA Fall - ambulates with SPC  HAND DOMINANCE: right  WEIGHT BEARING RESTRICTIONS: No  FALLS:  Has patient fallen in last 6 months? No but appear to be at risk; pt wants to focus on shoulder pain and breast cancer and does not want to address balance right now.  LIVING ENVIRONMENT: Patient lives with: herself Lives in:  House/apartment Has following equipment at home: Single point cane  OCCUPATION: retired Runner, broadcasting/film/video  LEISURE: She does not exercise  PRIOR LEVEL OF FUNCTION: Independent   OBJECTIVE:  COGNITION: Overall cognitive status: Within functional limits for tasks assessed    POSTURE:  Forward head and rounded shoulders posture  PALPATION:  Patient is tender to palpation right posterior shoulder and intrascapular region on the right side. Right scapula with poor mobility and sits protracted due to posture.   UPPER EXTREMITY AROM/PROM:  A/PROM RIGHT   eval  RIGHT  02/19/2023 Right  4/10 Right A/ROM (Deg) 02/28/23  Shoulder extension 65     Shoulder flexion 101 and painful 150  150 150  Shoulder abduction 140   140  Shoulder internal rotation 28 and painful     Shoulder external rotation 72 85      (Blank rows = not tested)  A/PROM LEFT   eval Left A/ROM 02/28/23  Shoulder extension 58   Shoulder flexion 136 150  Shoulder abduction 138 140  Shoulder internal rotation 64   Shoulder external rotation 68     (Blank rows = not tested)  CERVICAL AROM: All within normal limits:    Percent limited 02/28/23  Flexion WNL   Extension WNL   Right lateral flexion WNL 40  Left lateral flexion WNL 40  Right rotation 25% limited 55  Left rotation 25% limited 55    UPPER EXTREMITY STRENGTH: Not tested due to pain but she is functional  LYMPHEDEMA ASSESSMENTS:   LANDMARK RIGHT   eval  10 cm proximal to olecranon process 28.2  Olecranon process 23.4  10 cm proximal to ulnar styloid process 22  Just proximal to ulnar styloid process 15.6  Across hand at thumb web space 18.4  At base of 2nd digit 5.8  (Blank rows = not tested)  LANDMARK LEFT   eval  10 cm proximal to olecranon process 28.1  Olecranon process 23.1  10 cm proximal to ulnar styloid process 21.1  Just proximal to ulnar styloid process 15.8  Across hand at thumb web space 18  At base of 2nd digit 5.8  (Blank rows  = not tested)  L-DEX LYMPHEDEMA SCREENING:  The patient was assessed using the L-Dex machine today to produce a lymphedema index baseline score. The patient will be reassessed on a regular basis (typically every 3 months) to obtain new L-Dex  scores. If the score is > 6.5 points away from his/her baseline score indicating onset of subclinical lymphedema, it will be recommended to wear a compression garment for 4 weeks, 12 hours per day and then be reassessed. If the score continues to be > 6.5 points from baseline at reassessment, we will initiate lymphedema treatment. Assessing in this manner has a 95% rate of preventing clinically significant lymphedema.     QUICK DASH SURVEY:  Neldon Mc - 02/28/23 0001     Open a tight or new jar Moderate difficulty    Do heavy household chores (wash walls, wash floors) No difficulty    Carry a shopping bag or briefcase No difficulty    Wash your back No difficulty    Use a knife to cut food No difficulty    Recreational activities in which you take some force or impact through your arm, shoulder, or hand (golf, hammering, tennis) Mild difficulty    During the past week, to what extent has your arm, shoulder or hand problem interfered with your normal social activities with family, friends, neighbors, or groups? Slightly    During the past week, to what extent has your arm, shoulder or hand problem limited your work or other regular daily activities Not at all    Arm, shoulder, or hand pain. Mild    Tingling (pins and needles) in your arm, shoulder, or hand None    Difficulty Sleeping Mild difficulty    DASH Score 13.64 %            02/12/2023:  18.18 02/28/2023:  13.64   PATIENT EDUCATION:  Education details: Reviewed home exercise Person educated: Patient Education method: Explanation, Demonstration, Education comprehension: Patient verbalized understanding and returned demonstration  HOME EXERCISE PROGRAM:  Reviewed exercise from 01/2823   and upgraded dowel felxion and abduction in sitting with her own cane . Also added shoulder rolls and wall flexion stretch    Patient was instructed today in a home exercise program today for post op shoulder range of motion. These included active assist shoulder flexion in sitting, scapular retraction, wall walking with shoulder abduction, and hands behind head external rotation.  She was encouraged to do these twice a day, holding 3 seconds and repeating 5 times when permitted by her physician. Access Code: [email or web address] URL: [email or web address] Date: 02/13/2023 Prepared by: Lavinia Sharps  Exercises - Supine Shoulder Flexion AAROM with Dowel  - 2 x daily - 7 x weekly - 1 sets - 10 reps - Supine Shoulder Horizontal Abduction Adduction AAROM with Dowel  - 2 x daily - 7 x weekly - 1 sets - 10 reps - Seated Elbow Flexion Extension AAROM with Dowel into Wall (Mirrored)  - 2 x daily - 7 x weekly - 1 sets - 10 reps - Seated Thoracic Lumbar Extension with Pectoralis Stretch  - 2 x daily - 7 x weekly - 1 sets - 10 reps - Seated Shoulder Flexion Towel Slide at Table Top  - 2 x daily - 7 x weekly - 1 sets - 10 reps - Seated Scapular Retraction  - 1 x daily - 7 x weekly - 1 sets - 10 reps  Treatment:   02/28/2023: Seated green pball rollout 2x10 Supine flexion with cane 2x10 Supine chest press with cane 2x10 Supine shoulder horizontal abduction with yellow tband x15 reps Supine shoulder ER with yellow tband 2x10 Quick DASH Seated scapular retraction 2x10 Standing pulleys with 2# on one end: extension pulls (assisted elevation) 10x  4/10:   seated ROM with heat: UE Ranger flexion and horizontal abduction 15x each Supine passive ROM with heat: flexion, abduction, external rotation 10x each Supine clasped hands elevation 10x Seated green ball roll outs 15x Standing at the wall sliding pillow case 10x Standing facing wall with arm horizontally abducted with left neck rotation  10x Finger ladder to level 27 Seated pulleys 2 min each flexion and abduction  Standing pulleys with 2# on one end: extension pulls (assisted elevation) 10x    02/19/2023   Began with about 5 min of moist heat pack to relax tissues and prepare for STM with coco butter to right upper trap, posterior shoulder, scapular muscles in sitting. Myofascial release with prolonged pressure to tender points and subscapular area.  Pt with active right shoulder external rotation and 5 reps of isometric external rotation for muscle activation Folowed up with neck stretch lateral rotation to left, bilateral shoulder flexion and abduction with her own cane 2 sets of 5 , "wall wash" stretch.  These exercises were printed out for her to take home  and were briefly described to daughter.  Remeasured right shoulder and pt with significant improvement already.     3/28: Supine passive ROM with heat: flexion, abduction, external rotation 15x each Supine cane press 5x (painful) Supine cane flexion to forehead 15x Supine cane horizontal abduction 15x Seated cane forward/back press per HEP 15x Seated table slides flexion 15x Seated thoracic extension with towel roll as fulcrum hands behind neck 15x Seated scapular retraction 15x    ASSESSMENT:  CLINICAL IMPRESSION: Patient presents to skilled PT stating that this will likely be her last visit prior to her surgery.  Patient able to progress through session and continued A/ROM and strengthening.  Patient required cuing during new strengthening exercises with theraband, but was able to complete.  Obtained new A/ROM measurements and Quick DASH score.  Patient with good progress towards goals and feels that she is ready for surgery.  Patient will present for re-evaluation following procedure.  REHAB POTENTIAL: Good  CLINICAL DECISION MAKING: Stable/uncomplicated  EVALUATION COMPLEXITY: Low   GOALS: Goals reviewed with patient? YES  SHORT TERM GOALS:    Name  Target Date Goal status  1 Pt will be able to verbalize understanding of pertinent lymphedema risk reduction practices relevant to her dx specifically related to skin care.  Baseline:  No knowledge 02/28/2023 Achieved at eval  2 Pt will be able to return demo and/or verbalize understanding of the post op HEP related to regaining shoulder ROM. Baseline:  No knowledge 02/28/2023 Achieved at eval  3 Pt will be able to verbalize understanding of the importance of attending the post op After Breast CA Class for further lymphedema risk reduction education and therapeutic exercise.  Baseline:  No knowledge 02/28/2023 Achieved at eval  4 Patient will report >/= 25% reduction in pain in her right shoulder to better tolerate radiation positioning. 03/12/2023   5 Patient will improve her DASH score to be </= 10 for improved overall UE function. 03/12/2023 In Progress   LONG TERM GOALS:    1 Pt will demo she has regained full shoulder ROM and function post operatively compared to baselines.  Baseline: See objective measurements taken today. 04/09/2023     PLAN:  PT FREQUENCY/DURATION: EVAL and 1 follow up appointment for breast cancer. Will see 2/week for up to 4 weeks pre-operatively   PLAN FOR NEXT SESSION: PROM, AAROM exercises; consider dry needling suscapularis, lats, and right shoulder area.  OK to use heat/ice prior to surgery.  Surgery scheduled for 4/29; Will reassess 3-4 weeks post op to determine needs related to breast cancer.   Patient will follow up at outpatient cancer rehab 3-4 weeks following surgery.  If the patient requires physical therapy at that time, a specific plan will be dictated and sent to the referring physician for approval. The patient was educated today on appropriate basic range of motion exercises to begin post operatively and the importance of attending the After Breast Cancer class following surgery.  Patient was educated today on lymphedema risk reduction practices as it  pertains to recommendations that will benefit the patient immediately following surgery.  She verbalized good understanding.    Physical Therapy Information for After Breast Cancer Surgery/Treatment:  Lymphedema is a swelling condition that you may be at risk for in your arm if you have lymph nodes removed from the armpit area.  After a sentinel node biopsy, the risk is approximately 5-9% and is higher after an axillary node dissection.  There is treatment available for this condition and it is not life-threatening.  Contact your physician or physical therapist with concerns. You may begin the 4 shoulder/posture exercises (see additional sheet) when permitted by your physician (typically a week after surgery).  If you have drains, you may need to wait until those are removed before beginning range of motion exercises.  A general recommendation is to not lift your arms above shoulder height until drains are removed.  These exercises should be done to your tolerance and gently.  This is not a "no pain/no gain" type of recovery so listen to your body and stretch into the range of motion that you can tolerate, stopping if you have pain.  If you are having immediate reconstruction, ask your plastic surgeon about doing exercises as he or she may want you to wait. We encourage you to attend the free one time ABC (After Breast Cancer) class offered by [facility].  You will learn information related to lymphedema risk, prevention and treatment and additional exercises to regain mobility following surgery.  You can call [phone] for more information.  This is offered the 1st and 3rd Monday of each month.  You only attend the class one time. While undergoing any medical procedure or treatment, try to avoid blood pressure being taken or needle sticks from occurring on the arm on the side of cancer.   This recommendation begins after surgery and continues for the rest of your life.  This may  help reduce your risk of getting lymphedema (swelling in your arm). An excellent resource for those seeking information on lymphedema is the National Lymphedema Network's web site. It can be accessed at www.lymphnet.org If you notice swelling in your hand, arm or breast at any time following surgery (even if it is many years from now), please contact your doctor or physical therapist to discuss this.  Lymphedema can be treated at any time but it is easier for you if it is treated early on.  If you feel like your shoulder motion is not returning to normal in a reasonable amount of time, please contact your surgeon or physical therapist.  [facility] [phone]. [address], [place] [address]  ABC CLASS After Breast Cancer Class  After Breast Cancer Class is a specially designed exercise class to assist you in a safe recover after having breast cancer surgery.  In this class you will  learn how to get back to full function whether your drains were just removed or if you had surgery a month ago.  This one-time class is held the 1st and 3rd Monday of every month from 11:00 a.m. until 12:00 noon virtually.  This class is FREE and space is limited. For more information or to register for the next available class, call [phone].  Class Goals  Understand specific stretches to improve the flexibility of you chest and shoulder. Learn ways to safely strengthen your upper body and improve your posture. Understand the warning signs of infection and why you may be at risk for an arm infection. Learn about Lymphedema and prevention.  ** You do not attend this class until after surgery.  Drains must be removed to participate  Patient was instructed today in a home exercise program today for post op shoulder range of motion. These included active assist shoulder flexion in sitting, scapular retraction, wall walking with shoulder abduction, and hands  behind head external rotation.  She was encouraged to do these twice a day, holding 3 seconds and repeating 5 times when permitted by her physician.   Reather Laurence, PT 02/28/23 11:32 AM  [facility] [address], [address] [place], [place] [address] Phone # [phone] Fax [phone]

## 2023-03-06 ENCOUNTER — Ambulatory Visit
Admission: RE | Admit: 2023-03-06 | Discharge: 2023-03-06 | Disposition: A | Discharge status: Home / Self Care | Payer: Medicare PPO | Source: Ambulatory Visit | Attending: General Surgery | Admitting: General Surgery

## 2023-03-06 DIAGNOSIS — R928 Other abnormal and inconclusive findings on diagnostic imaging of breast: Secondary | ICD-10-CM

## 2023-03-06 MED ORDER — GADOPICLENOL 0.5 MMOL/ML IV SOLN
6.0000 mL | Freq: Once | INTRAVENOUS | Status: AC | PRN
  Administered 2023-03-06: 6 mL via INTRAVENOUS

## 2023-03-10 ENCOUNTER — Encounter (HOSPITAL_BASED_OUTPATIENT_CLINIC_OR_DEPARTMENT_OTHER): Payer: Self-pay | Admitting: General Surgery

## 2023-03-10 NOTE — Progress Notes (Signed)
   03/10/23 1447  PAT Phone Screen  Is the patient taking a GLP-1 receptor agonist? No  Do You Have Diabetes? No  Do You Have Hypertension? Yes  Have You Ever Been to the ER for Asthma? No  Have You Taken Oral Steroids in the Past 3 Months? No  Do you Take Phenteramine or any Other Diet Drugs? No  Recent  Lab Work, EKG, CXR? Yes  Where was this test performed? (S)  [facility] (Cardiac hx reviewed by [facility], ok for surgery)  Do you have a history of heart problems? Yes  Cardiologist Name Custovic, DO  Have you ever had tests on your heart? Yes  What cardiac tests were performed? Echo;EKG;Stress Test  What date/year were cardiac tests completed? (S)  EKG 01/15/22, ECHO 07/25/21, Stress Test 09/05/21  Results viewable: [facility] Media Tab;Care Everywhere  Any Recent Hospitalizations? No  Height  (1.575 m)  Weight 62.8 kg  Pat Appointment Scheduled Yes

## 2023-03-10 NOTE — Progress Notes (Signed)
Cardiac history reviewed with Dr. Stephannie Peters. Pt cleared for surgery.

## 2023-03-12 ENCOUNTER — Encounter: Payer: Self-pay | Admitting: *Deleted

## 2023-03-13 ENCOUNTER — Ambulatory Visit: Payer: Self-pay | Admitting: General Surgery

## 2023-03-13 DIAGNOSIS — Z171 Estrogen receptor negative status [ER-]: Secondary | ICD-10-CM

## 2023-03-13 MED ORDER — KETOROLAC TROMETHAMINE 15 MG/ML IJ SOLN: 15.0000 mg | Freq: Once | INTRAMUSCULAR | Status: AC

## 2023-03-14 ENCOUNTER — Encounter (HOSPITAL_BASED_OUTPATIENT_CLINIC_OR_DEPARTMENT_OTHER)
Admission: RE | Admit: 2023-03-14 | Discharge: 2023-03-14 | Disposition: A | Discharge status: Home / Self Care | Payer: Medicare PPO | Source: Ambulatory Visit | Attending: General Surgery | Admitting: General Surgery

## 2023-03-14 DIAGNOSIS — I10 Essential (primary) hypertension: Secondary | ICD-10-CM | POA: Insufficient documentation

## 2023-03-14 DIAGNOSIS — Z01818 Encounter for other preprocedural examination: Secondary | ICD-10-CM | POA: Insufficient documentation

## 2023-03-14 LAB — BASIC METABOLIC PANEL
Anion gap: 12 (ref 5–15)
BUN: 17 mg/dL (ref 8–23)
CO2: 22 mmol/L (ref 22–32)
Calcium: 9.9 mg/dL (ref 8.9–10.3)
Chloride: 104 mmol/L (ref 98–111)
Creatinine, Ser: 0.99 mg/dL (ref 0.44–1.00)
GFR, Estimated: 58 mL/min — ABNORMAL LOW (ref >60)
Glucose, Bld: 70 mg/dL (ref 70–99)
Potassium: 4.6 mmol/L (ref 3.5–5.1)
Sodium: 138 mmol/L (ref 135–145)

## 2023-03-14 MED ORDER — CHLORHEXIDINE GLUCONATE CLOTH 2 % EX PADS: 6.0000 | MEDICATED_PAD | Freq: Once | CUTANEOUS | Status: DC

## 2023-03-14 NOTE — Progress Notes (Signed)

## 2023-03-17 ENCOUNTER — Other Ambulatory Visit: Payer: Self-pay

## 2023-03-17 ENCOUNTER — Encounter (HOSPITAL_BASED_OUTPATIENT_CLINIC_OR_DEPARTMENT_OTHER): Payer: Self-pay | Admitting: General Surgery

## 2023-03-17 ENCOUNTER — Encounter (HOSPITAL_BASED_OUTPATIENT_CLINIC_OR_DEPARTMENT_OTHER)
Admission: RE | Disposition: A | Discharge status: Home / Self Care | Payer: Self-pay | Source: Home / Self Care | Attending: General Surgery

## 2023-03-17 ENCOUNTER — Ambulatory Visit (HOSPITAL_BASED_OUTPATIENT_CLINIC_OR_DEPARTMENT_OTHER): Payer: Medicare PPO | Admitting: Certified Registered"

## 2023-03-17 ENCOUNTER — Ambulatory Visit (HOSPITAL_COMMUNITY)
Admission: RE | Admit: 2023-03-17 | Discharge: 2023-03-17 | Disposition: A | Discharge status: Home / Self Care | Payer: Medicare PPO | Source: Ambulatory Visit | Attending: General Surgery | Admitting: General Surgery

## 2023-03-17 ENCOUNTER — Ambulatory Visit (HOSPITAL_BASED_OUTPATIENT_CLINIC_OR_DEPARTMENT_OTHER)
Admission: RE | Admit: 2023-03-17 | Discharge: 2023-03-18 | Disposition: A | Discharge status: Home / Self Care | Payer: Medicare PPO | Attending: General Surgery | Admitting: General Surgery

## 2023-03-17 DIAGNOSIS — I444 Left anterior fascicular block: Secondary | ICD-10-CM | POA: Diagnosis not present

## 2023-03-17 DIAGNOSIS — K219 Gastro-esophageal reflux disease without esophagitis: Secondary | ICD-10-CM | POA: Insufficient documentation

## 2023-03-17 DIAGNOSIS — Z833 Family history of diabetes mellitus: Secondary | ICD-10-CM | POA: Insufficient documentation

## 2023-03-17 DIAGNOSIS — C50911 Malignant neoplasm of unspecified site of right female breast: Secondary | ICD-10-CM | POA: Diagnosis present

## 2023-03-17 DIAGNOSIS — Z171 Estrogen receptor negative status [ER-]: Secondary | ICD-10-CM | POA: Insufficient documentation

## 2023-03-17 DIAGNOSIS — R519 Headache, unspecified: Secondary | ICD-10-CM | POA: Diagnosis not present

## 2023-03-17 DIAGNOSIS — I10 Essential (primary) hypertension: Secondary | ICD-10-CM | POA: Diagnosis not present

## 2023-03-17 DIAGNOSIS — C50511 Malignant neoplasm of lower-outer quadrant of right female breast: Secondary | ICD-10-CM | POA: Insufficient documentation

## 2023-03-17 DIAGNOSIS — J45909 Unspecified asthma, uncomplicated: Secondary | ICD-10-CM | POA: Insufficient documentation

## 2023-03-17 DIAGNOSIS — D573 Sickle-cell trait: Secondary | ICD-10-CM | POA: Diagnosis not present

## 2023-03-17 DIAGNOSIS — K449 Diaphragmatic hernia without obstruction or gangrene: Secondary | ICD-10-CM | POA: Diagnosis not present

## 2023-03-17 DIAGNOSIS — M1990 Unspecified osteoarthritis, unspecified site: Secondary | ICD-10-CM | POA: Insufficient documentation

## 2023-03-17 DIAGNOSIS — Z87891 Personal history of nicotine dependence: Secondary | ICD-10-CM | POA: Diagnosis not present

## 2023-03-17 HISTORY — PX: MASTECTOMY W/ SENTINEL NODE BIOPSY: SHX2001

## 2023-03-17 HISTORY — DX: Disorder of kidney and ureter, unspecified: N28.9

## 2023-03-17 SURGERY — MASTECTOMY WITH SENTINEL LYMPH NODE BIOPSY
Anesthesia: Regional | Site: Breast | Laterality: Right

## 2023-03-17 MED ORDER — GABAPENTIN 300 MG PO CAPS
ORAL_CAPSULE | ORAL | Status: AC
  Filled 2023-03-17: qty 1

## 2023-03-17 MED ORDER — EPHEDRINE SULFATE (PRESSORS) 50 MG/ML IJ SOLN
INTRAMUSCULAR | Status: DC | PRN
  Administered 2023-03-17: 10 mg via INTRAVENOUS

## 2023-03-17 MED ORDER — FENTANYL CITRATE (PF) 100 MCG/2ML IJ SOLN
100.0000 ug | Freq: Once | INTRAMUSCULAR | Status: AC
  Administered 2023-03-17: 50 ug via INTRAVENOUS

## 2023-03-17 MED ORDER — FENTANYL CITRATE (PF) 100 MCG/2ML IJ SOLN
INTRAMUSCULAR | Status: AC
  Filled 2023-03-17: qty 2

## 2023-03-17 MED ORDER — ONDANSETRON HCL 4 MG/2ML IJ SOLN
INTRAMUSCULAR | Status: AC
  Filled 2023-03-17: qty 2

## 2023-03-17 MED ORDER — GABAPENTIN 100 MG PO CAPS
100.0000 mg | ORAL_CAPSULE | ORAL | Status: AC
  Administered 2023-03-17: 100 mg via ORAL

## 2023-03-17 MED ORDER — CHLORHEXIDINE GLUCONATE CLOTH 2 % EX PADS: 6.0000 | MEDICATED_PAD | Freq: Once | CUTANEOUS | Status: DC

## 2023-03-17 MED ORDER — FENTANYL CITRATE (PF) 100 MCG/2ML IJ SOLN
INTRAMUSCULAR | Status: DC | PRN
  Administered 2023-03-17: 50 ug via INTRAVENOUS
  Administered 2023-03-17 (×6): 25 ug via INTRAVENOUS

## 2023-03-17 MED ORDER — OXYCODONE HCL 5 MG PO TABS
5.0000 mg | ORAL_TABLET | ORAL | Status: DC | PRN
  Administered 2023-03-17 (×3): 5 mg via ORAL
  Filled 2023-03-17 (×3): qty 1

## 2023-03-17 MED ORDER — ACETAMINOPHEN 500 MG PO TABS
ORAL_TABLET | ORAL | Status: AC
  Filled 2023-03-17: qty 2

## 2023-03-17 MED ORDER — CEFAZOLIN SODIUM-DEXTROSE 2-3 GM-%(50ML) IV SOLR
INTRAVENOUS | Status: DC | PRN
  Administered 2023-03-17: 2 g via INTRAVENOUS

## 2023-03-17 MED ORDER — SODIUM CHLORIDE 0.9 % IV SOLN: INTRAVENOUS | Status: DC

## 2023-03-17 MED ORDER — METOCLOPRAMIDE HCL 5 MG PO TABS: 10.0000 mg | ORAL_TABLET | Freq: Every day | ORAL | Status: DC

## 2023-03-17 MED ORDER — GABAPENTIN 300 MG PO CAPS
300.0000 mg | ORAL_CAPSULE | Freq: Two times a day (BID) | ORAL | Status: DC
  Administered 2023-03-17 (×2): 300 mg via ORAL
  Filled 2023-03-17: qty 1

## 2023-03-17 MED ORDER — CEFAZOLIN SODIUM-DEXTROSE 2-4 GM/100ML-% IV SOLN: 2.0000 g | INTRAVENOUS | Status: DC

## 2023-03-17 MED ORDER — ONDANSETRON HCL 4 MG/2ML IJ SOLN: 4.0000 mg | Freq: Four times a day (QID) | INTRAMUSCULAR | Status: DC | PRN

## 2023-03-17 MED ORDER — MONTELUKAST SODIUM 10 MG PO TABS
10.0000 mg | ORAL_TABLET | Freq: Every day | ORAL | Status: DC
  Administered 2023-03-17: 10 mg via ORAL
  Filled 2023-03-17: qty 1

## 2023-03-17 MED ORDER — ROPIVACAINE HCL 5 MG/ML IJ SOLN
INTRAMUSCULAR | Status: DC | PRN
  Administered 2023-03-17: 25 mL via PERINEURAL

## 2023-03-17 MED ORDER — LACTATED RINGERS IV SOLN: INTRAVENOUS | Status: DC | PRN

## 2023-03-17 MED ORDER — ONDANSETRON HCL 4 MG/2ML IJ SOLN
INTRAMUSCULAR | Status: DC | PRN
  Administered 2023-03-17: 4 mg via INTRAVENOUS

## 2023-03-17 MED ORDER — FLUTICASONE PROPIONATE HFA 110 MCG/ACT IN AERO
1.0000 | INHALATION_SPRAY | Freq: Two times a day (BID) | RESPIRATORY_TRACT | Status: DC
  Filled 2023-03-17: qty 12

## 2023-03-17 MED ORDER — LACTATED RINGERS IV SOLN: INTRAVENOUS | Status: DC

## 2023-03-17 MED ORDER — GABAPENTIN 100 MG PO CAPS: 100.0000 mg | ORAL_CAPSULE | ORAL | Status: DC

## 2023-03-17 MED ORDER — FENTANYL CITRATE (PF) 100 MCG/2ML IJ SOLN
25.0000 ug | INTRAMUSCULAR | Status: DC | PRN
  Administered 2023-03-17 (×3): 50 ug via INTRAVENOUS

## 2023-03-17 MED ORDER — ROSUVASTATIN CALCIUM 5 MG PO TABS
5.0000 mg | ORAL_TABLET | Freq: Every day | ORAL | Status: DC
  Administered 2023-03-17: 5 mg via ORAL
  Filled 2023-03-17: qty 1

## 2023-03-17 MED ORDER — AMLODIPINE BESYLATE 5 MG PO TABS
5.0000 mg | ORAL_TABLET | Freq: Every day | ORAL | Status: DC
  Administered 2023-03-17: 5 mg via ORAL
  Filled 2023-03-17: qty 1

## 2023-03-17 MED ORDER — PROPOFOL 500 MG/50ML IV EMUL
INTRAVENOUS | Status: DC | PRN
  Administered 2023-03-17: 35 ug/kg/min via INTRAVENOUS

## 2023-03-17 MED ORDER — OXYCODONE HCL 5 MG PO TABS: 5.0000 mg | ORAL_TABLET | Freq: Four times a day (QID) | ORAL | 0 refills | Status: AC | PRN

## 2023-03-17 MED ORDER — 0.9 % SODIUM CHLORIDE (POUR BTL) OPTIME
TOPICAL | Status: DC | PRN
  Administered 2023-03-17: 200 mL

## 2023-03-17 MED ORDER — ONDANSETRON 4 MG PO TBDP: 4.0000 mg | ORAL_TABLET | Freq: Four times a day (QID) | ORAL | Status: DC | PRN

## 2023-03-17 MED ORDER — ROCURONIUM BROMIDE 10 MG/ML (PF) SYRINGE
PREFILLED_SYRINGE | INTRAVENOUS | Status: AC
  Filled 2023-03-17: qty 10

## 2023-03-17 MED ORDER — ACETAMINOPHEN 500 MG PO TABS
1000.0000 mg | ORAL_TABLET | ORAL | Status: AC
  Administered 2023-03-17: 1000 mg via ORAL

## 2023-03-17 MED ORDER — MORPHINE SULFATE (PF) 4 MG/ML IV SOLN: 1.0000 mg | INTRAVENOUS | Status: DC | PRN

## 2023-03-17 MED ORDER — TECHNETIUM TC 99M TILMANOCEPT KIT
1.0000 | PACK | Freq: Once | INTRAVENOUS | Status: AC | PRN
  Administered 2023-03-17: 1 via INTRADERMAL

## 2023-03-17 MED ORDER — PROPOFOL 10 MG/ML IV BOLUS
INTRAVENOUS | Status: DC | PRN
  Administered 2023-03-17: 150 mg via INTRAVENOUS

## 2023-03-17 MED ORDER — DEXAMETHASONE SODIUM PHOSPHATE 10 MG/ML IJ SOLN
INTRAMUSCULAR | Status: AC
  Filled 2023-03-17: qty 1

## 2023-03-17 MED ORDER — ACETAMINOPHEN 500 MG PO TABS: 1000.0000 mg | ORAL_TABLET | ORAL | Status: DC

## 2023-03-17 MED ORDER — MIDAZOLAM HCL 2 MG/2ML IJ SOLN
INTRAMUSCULAR | Status: AC
  Filled 2023-03-17: qty 2

## 2023-03-17 MED ORDER — MEMANTINE HCL 10 MG PO TABS
10.0000 mg | ORAL_TABLET | Freq: Every day | ORAL | Status: DC
  Administered 2023-03-17: 10 mg via ORAL
  Filled 2023-03-17: qty 1

## 2023-03-17 MED ORDER — PANTOPRAZOLE SODIUM 40 MG PO TBEC
40.0000 mg | DELAYED_RELEASE_TABLET | Freq: Every day | ORAL | Status: DC
  Administered 2023-03-17: 40 mg via ORAL
  Filled 2023-03-17: qty 1

## 2023-03-17 MED ORDER — DEXAMETHASONE SODIUM PHOSPHATE 10 MG/ML IJ SOLN
INTRAMUSCULAR | Status: DC | PRN
  Administered 2023-03-17: 5 mg via INTRAVENOUS

## 2023-03-17 MED ORDER — PHENYLEPHRINE HCL (PRESSORS) 10 MG/ML IV SOLN
INTRAVENOUS | Status: DC | PRN
  Administered 2023-03-17: 160 ug via INTRAVENOUS
  Administered 2023-03-17 (×2): 80 ug via INTRAVENOUS

## 2023-03-17 MED ORDER — GABAPENTIN 100 MG PO CAPS
ORAL_CAPSULE | ORAL | Status: AC
  Filled 2023-03-17: qty 1

## 2023-03-17 MED ORDER — METHOCARBAMOL 500 MG PO TABS
500.0000 mg | ORAL_TABLET | Freq: Four times a day (QID) | ORAL | Status: DC | PRN
  Administered 2023-03-17: 500 mg via ORAL
  Filled 2023-03-17 (×2): qty 1

## 2023-03-17 MED ORDER — METHOCARBAMOL 500 MG PO TABS: 500.0000 mg | ORAL_TABLET | Freq: Four times a day (QID) | ORAL | 1 refills | Status: AC | PRN

## 2023-03-17 MED ORDER — ENOXAPARIN SODIUM 30 MG/0.3ML IJ SOSY
30.0000 mg | PREFILLED_SYRINGE | INTRAMUSCULAR | Status: DC
  Filled 2023-03-17: qty 0.3

## 2023-03-17 MED ORDER — LIDOCAINE HCL (CARDIAC) PF 100 MG/5ML IV SOSY
PREFILLED_SYRINGE | INTRAVENOUS | Status: DC | PRN
  Administered 2023-03-17: 60 mg via INTRAVENOUS

## 2023-03-17 MED ORDER — OXYCODONE HCL 5 MG PO TABS: 5.0000 mg | ORAL_TABLET | Freq: Once | ORAL | Status: DC | PRN

## 2023-03-17 MED ORDER — CEFAZOLIN SODIUM-DEXTROSE 2-4 GM/100ML-% IV SOLN
INTRAVENOUS | Status: AC
  Filled 2023-03-17: qty 100

## 2023-03-17 MED ORDER — OXYCODONE HCL 5 MG/5ML PO SOLN: 5.0000 mg | Freq: Once | ORAL | Status: DC | PRN

## 2023-03-17 MED ORDER — PROPOFOL 10 MG/ML IV BOLUS
INTRAVENOUS | Status: AC
  Filled 2023-03-17: qty 20

## 2023-03-17 SURGICAL SUPPLY — 46 items
ADH SKN CLS APL DERMABOND .7 (GAUZE/BANDAGES/DRESSINGS) ×1
APL PRP STRL LF DISP 70% ISPRP (MISCELLANEOUS) ×1
APPLIER CLIP 9.375 MED OPEN (MISCELLANEOUS) ×2
APR CLP MED 9.3 20 MLT OPN (MISCELLANEOUS) ×2
BIOPATCH RED 1 DISK 7.0 (GAUZE/BANDAGES/DRESSINGS) ×1 IMPLANT
BLADE SURG 10 STRL SS (BLADE) ×1 IMPLANT
BLADE SURG 15 STRL LF DISP TIS (BLADE) ×1 IMPLANT
BLADE SURG 15 STRL SS (BLADE) ×1
CANISTER SUCT 1200ML W/VALVE (MISCELLANEOUS) ×1 IMPLANT
CHLORAPREP W/TINT 26 (MISCELLANEOUS) ×1 IMPLANT
CLIP APPLIE 9.375 MED OPEN (MISCELLANEOUS) ×1 IMPLANT
COVER BACK TABLE 60X90IN (DRAPES) ×1 IMPLANT
COVER MAYO STAND STRL (DRAPES) ×1 IMPLANT
COVER PROBE CYLINDRICAL 5X96 (MISCELLANEOUS) ×1 IMPLANT
DERMABOND ADVANCED .7 DNX12 (GAUZE/BANDAGES/DRESSINGS) ×1 IMPLANT
DEVICE DSSCT PLSMBLD 3.0S LGHT (MISCELLANEOUS) ×1 IMPLANT
DRAIN CHANNEL 19F RND (DRAIN) ×1 IMPLANT
DRAPE LAPAROSCOPIC ABDOMINAL (DRAPES) ×1 IMPLANT
DRAPE UTILITY XL STRL (DRAPES) ×1 IMPLANT
DRSG TEGADERM 2-3/8X2-3/4 SM (GAUZE/BANDAGES/DRESSINGS) ×1 IMPLANT
ELECT REM PT RETURN 9FT ADLT (ELECTROSURGICAL) ×1
ELECTRODE REM PT RTRN 9FT ADLT (ELECTROSURGICAL) ×1 IMPLANT
EVACUATOR SILICONE 100CC (DRAIN) ×1 IMPLANT
GAUZE PAD ABD 8X10 STRL (GAUZE/BANDAGES/DRESSINGS) ×1 IMPLANT
GAUZE SPONGE 4X4 12PLY STRL LF (GAUZE/BANDAGES/DRESSINGS) ×1 IMPLANT
GLOVE BIO SURGEON STRL SZ 6.5 (GLOVE) IMPLANT
GLOVE BIO SURGEON STRL SZ7.5 (GLOVE) ×1 IMPLANT
GLOVE BIOGEL PI IND STRL 7.0 (GLOVE) IMPLANT
GOWN STRL REUS W/ TWL LRG LVL3 (GOWN DISPOSABLE) ×2 IMPLANT
GOWN STRL REUS W/TWL LRG LVL3 (GOWN DISPOSABLE) ×2
NDL HYPO 25X1 1.5 SAFETY (NEEDLE) ×1 IMPLANT
NEEDLE HYPO 25X1 1.5 SAFETY (NEEDLE) ×1 IMPLANT
NS IRRIG 1000ML POUR BTL (IV SOLUTION) ×1 IMPLANT
PACK BASIN DAY SURGERY FS (CUSTOM PROCEDURE TRAY) ×1 IMPLANT
PIN SAFETY STERILE (MISCELLANEOUS) ×1 IMPLANT
PLASMABLADE 3.0S W/LIGHT (MISCELLANEOUS) ×1
SLEEVE SCD COMPRESS KNEE MED (STOCKING) ×1 IMPLANT
SPONGE T-LAP 18X18 ~~LOC~~+RFID (SPONGE) ×1 IMPLANT
SUT ETHILON 2 0 FS 18 (SUTURE) ×1 IMPLANT
SUT MNCRL AB 4-0 PS2 18 (SUTURE) ×1 IMPLANT
SUT SILK 2 0 SH (SUTURE) IMPLANT
SUT VICRYL 3-0 CR8 SH (SUTURE) ×1 IMPLANT
SYR CONTROL 10ML LL (SYRINGE) ×1 IMPLANT
TOWEL GREEN STERILE FF (TOWEL DISPOSABLE) ×1 IMPLANT
TUBE CONNECTING 20X1/4 (TUBING) ×1 IMPLANT
YANKAUER SUCT BULB TIP NO VENT (SUCTIONS) ×1 IMPLANT

## 2023-03-17 NOTE — Progress Notes (Signed)
Assisted Dr. Hodierne with right, pectoralis, ultrasound guided block. Side rails up, monitors on throughout procedure. See vital signs in flow sheet. Tolerated Procedure well. 

## 2023-03-17 NOTE — Anesthesia Preprocedure Evaluation (Signed)
Anesthesia Evaluation  Patient identified by MRN, date of birth, ID band Patient awake    Reviewed: Allergy & Precautions, H&P , NPO status , Patient's Chart, lab work & pertinent test results  Airway Mallampati: II   Neck ROM: full    Dental   Pulmonary asthma , former smoker   breath sounds clear to auscultation       Cardiovascular hypertension,  Rhythm:regular Rate:Normal     Neuro/Psych  Headaches CVA    GI/Hepatic hiatal hernia,GERD  ,,  Endo/Other    Renal/GU      Musculoskeletal  (+) Arthritis ,    Abdominal   Peds  Hematology  (+) Blood dyscrasia, Sickle cell trait   Anesthesia Other Findings   Reproductive/Obstetrics                             Anesthesia Physical Anesthesia Plan  ASA: 3  Anesthesia Plan: General   Post-op Pain Management: Regional block*   Induction: Intravenous  PONV Risk Score and Plan: 3 and Ondansetron, Dexamethasone and Treatment may vary due to age or medical condition  Airway Management Planned: LMA  Additional Equipment:   Intra-op Plan:   Post-operative Plan: Extubation in OR  Informed Consent: I have reviewed the patients History and Physical, chart, labs and discussed the procedure including the risks, benefits and alternatives for the proposed anesthesia with the patient or authorized representative who has indicated his/her understanding and acceptance.     Dental advisory given  Plan Discussed with: CRNA, Anesthesiologist and Surgeon  Anesthesia Plan Comments:        Anesthesia Quick Evaluation

## 2023-03-17 NOTE — Anesthesia Procedure Notes (Signed)
Anesthesia Regional Block: Pectoralis block   Pre-Anesthetic Checklist: , timeout performed,  Correct Patient, Correct Site, Correct Laterality,  Correct Procedure, Correct Position, site marked,  Risks and benefits discussed,  Surgical consent,  Pre-op evaluation,  At surgeon's request and post-op pain management  Laterality: Right  Prep: chloraprep       Needles:  Injection technique: Single-shot  Needle Type: Echogenic Needle     Needle Length: 9cm  Needle Gauge: 21     Additional Needles:   Narrative:  Start time: 03/17/2023 9:05 AM End time: 03/17/2023 9:12 AM Injection made incrementally with aspirations every 5 mL.  Performed by: Personally  Anesthesiologist: Achille Rich, MD  Additional Notes: Pt tolerated the procedure well.

## 2023-03-17 NOTE — Op Note (Signed)
03/17/2023  11:46 AM  PATIENT:  Deanna Rose  77 y.o. female  PRE-OPERATIVE DIAGNOSIS:  RIGHT BREAST CANCER  POST-OPERATIVE DIAGNOSIS:  RIGHT BREAST CANCER  PROCEDURE:  Procedure(s): RIGHT MASTECTOMY WITH DEEP RIGHT AXILLARY SENTINEL LYMPH NODE BIOPSY (Right)  SURGEON:  Surgeon(s) and Role:    * Griselda Miner, MD - Primary  PHYSICIAN ASSISTANT:   ASSISTANTS: none   ANESTHESIA:   general  EBL:  15 mL   BLOOD ADMINISTERED:none  DRAINS: (1) Blake drain(s) in the prepectoral space    LOCAL MEDICATIONS USED:  NONE  SPECIMEN:  Source of Specimen:  right mastectomy and sentinel nodes x 3  DISPOSITION OF SPECIMEN:  PATHOLOGY  COUNTS:  YES  TOURNIQUET:  * No tourniquets in log *  DICTATION: .Dragon Dictation  After informed consent was obtained the patient was brought to the operating room and placed in the supine position on the operating table.  After adequate induction of general anesthesia the patient's right chest, breast, and axillary area were prepped with ChloraPrep, allowed to dry, and draped in usual sterile manner.  An appropriate timeout was performed.  Earlier in the day the patient underwent injection of 1 mCi of technetium sulfur colloid in the subareolar position on the right.  At this point, an elliptical incision was made around the nipple and areola complex in order to minimize the excess skin.  This was done with a 10 blade knife.  The incision was then carried through the skin and subcutaneous tissue sharply with the PlasmaBlade.  Breast hooks were used to elevate the skin flaps anteriorly towards the ceiling.  Thin skin flaps were then created by dissecting between the breast tissue in the subcutaneous fat and skin.  This dissection was carried circumferentially all the way to the chest wall.  Next the breast was removed from the pectoralis muscle with the pectoralis fascia.  Once this was accomplished the entire right breast was removed from the patient.   It was oriented with a stitch on the lateral skin and sent to pathology for further evaluation.  Hemostasis was achieved using the Bovie electrocautery.  Several small vessels laterally were controlled with clips.  A couple of perforating vessels medially were controlled with figure-of-eight 3-0 Vicryl stitches.  The neoprobe was set to technetium and 2 lymph nodes with increased signal were identified in the deep right axillary space.  These were excised sharply with the PlasmaBlade and the surrounding small vessels and lymphatics were controlled with clips.  Ex vivo counts on these 2 nodes ranged from 200-800.  1 other palpable lymph node in the same area was also excised sharply with the PlasmaBlade and the surrounding small vessels and lymphatics were controlled with clips.  These were sent as sentinel nodes numbers 1 through 3.  No other hot or palpable nodes were identified in the right axilla.  Next a small stab incision was made near the anterior axillary line inferior to the operative bed.  Tonsil clamp was placed through this opening and used to bring a 19 [place] round Blake drain into the operative bed.  The drain was curled along the chest wall.  The drain was anchored to the skin with a 3-0 nylon stitch.  Next the superior and inferior skin flaps were grossly reapproximated with interrupted 3-0 Vicryl stitches.  The skin was then closed with a running 4-0 Monocryl subcuticular stitch.  Dermabond dressings were then applied.  Sterile drain dressings were applied.  The patient tolerated the procedure  well.  At the end of the case all needle sponge and instrument counts were correct.  The patient was then awakened and taken to recovery in stable condition.  PLAN OF CARE: Admit for overnight observation  PATIENT DISPOSITION:  PACU - hemodynamically stable.   Delay start of Pharmacological VTE agent (>24hrs) due to surgical blood loss or risk of bleeding: no

## 2023-03-17 NOTE — Interval H&P Note (Signed)
History and Physical Interval Note:  03/17/2023 9:50 AM  Deanna Rose  has presented today for surgery, with the diagnosis of RIGHT BREAST CANCER.  The various methods of treatment have been discussed with the patient and family. After consideration of risks, benefits and other options for treatment, the patient has consented to  Procedure(s): MASTECTOMY WITH SENTINEL LYMPH NODE BIOPSY (Right) as a surgical intervention.  The patient's history has been reviewed, patient examined, no change in status, stable for surgery.  I have reviewed the patient's chart and labs.  Questions were answered to the patient's satisfaction.     Chevis Pretty III

## 2023-03-17 NOTE — Progress Notes (Signed)
Nuclear Medicine with RN at bedside for right breast injections.  VSS and pt tolerated procedure well.

## 2023-03-17 NOTE — H&P (Signed)
REFERRING PHYSICIAN: Lucia Bitter* PROVIDER: Lindell Noe, MD MRN: [id number] DOB: 05-29-1946 Subjective   Chief Complaint: Breast Cancer  History of Present Illness: Deanna Rose is a 77 y.o. female who is seen today as an office consultation for evaluation of Breast Cancer  We are asked to see the patient in consultation by Dr. Al Pimple to evaluate her for a new right breast cancer. The patient is a 77 year old black female who has been experiencing right nipple discharge for the last year or more. The discharge occurs daily. She describes the discharge as bloody in nature. She underwent mammogram and ultrasound which did show an 8 mm and a 1.6 cm mass in the lower outer subareolar right breast. The axilla looked normal. 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 She quit smoking years ago and has had a mini stroke but has no deficits. Is not on any blood thinners.  Review of Systems: A complete review of systems was obtained from the patient. I have reviewed this information and discussed as appropriate with the patient. See HPI as well for other ROS.  ROS   Medical History: Past Medical History:  Diagnosis Date  Arthritis  Asthma, unspecified asthma severity, unspecified whether complicated, unspecified whether persistent  GERD (gastroesophageal reflux disease)  History of cancer  History of stroke  Hypertension   Patient Active Problem List  Diagnosis  Malignant neoplasm of lower-outer quadrant of right breast of female, estrogen receptor negative   Past Surgical History:  Procedure Laterality Date  breast surgery  1970-breast excisional biopsy left breast  JOINT REPLACEMENT    Allergies  Allergen Reactions  Sulfa (Sulfonamide Antibiotics) Hives   Current Outpatient Medications on File Prior to Visit  Medication Sig Dispense Refill   aMILoride-hydroCHLOROthiazide (MODURETIC) 5-50 mg tablet Take 1 tablet by mouth once daily  amLODIPine (NORVASC) 5 MG tablet Take 5 mg by mouth once daily  ascorbic acid, vitamin C, (VITAMIN C) 500 MG tablet Take by mouth  b complex vitamins tablet Take 1 tablet by mouth once daily  baclofen (LIORESAL) 10 MG tablet Take 10 mg by mouth 3 (three) times daily as needed  calcium carbonate-vitamin D3 (CALTRATE 600+D) 600 mg-5 mcg (200 unit) tablet Take 1 tablet by mouth once daily  citalopram (CELEXA) 20 MG tablet Take 20 mg by mouth at bedtime  FLOVENT HFA 110 mcg/actuation inhaler INHALE 1 PUFF INTO LUNGS TWICE DAILY AS DIRECTED  gabapentin (NEURONTIN) 300 MG capsule Take 300 mg by mouth 2 (two) times daily  magnesium oxide (MAG-OX) 400 mg (241.3 mg magnesium) tablet Take 1 tablet by mouth 2 (two) times daily as needed  memantine (NAMENDA) 10 MG tablet Take 10 mg by mouth at bedtime  metoclopramide (REGLAN) 10 MG tablet Take 10 mg by mouth once daily  montelukast (SINGULAIR) 10 mg tablet Take 10 mg by mouth once daily  omeprazole (PRILOSEC) 40 MG DR capsule Take 40 mg by mouth once daily  rosuvastatin (CRESTOR) 5 MG tablet Take 5 mg by mouth at bedtime  vitamin E 400 UNIT capsule Take by mouth   No current facility-administered medications on file prior to visit.   Family History  Problem Relation Age of Onset  High blood pressure (Hypertension) Mother  Hyperlipidemia (Elevated cholesterol) Mother  Diabetes Mother  Deep vein thrombosis (DVT or abnormal blood clot formation) Mother  Coronary Artery Disease (Blocked arteries around heart) Father  Diabetes Sister  Coronary Artery Disease (Blocked arteries around heart) Sister    Social History   Tobacco Use  Smoking Status Former  Types: Cigarettes  Quit date: 1970  Years since quitting: 54.2  Smokeless Tobacco Never    Social History   Socioeconomic History  Marital status: Divorced  Tobacco Use  Smoking status: Former   Types: Cigarettes  Quit date: 1970  Years since quitting: 54.2  Smokeless tobacco: Never  Substance and Sexual Activity  Alcohol use: Never  Drug use: Never   Objective:  There were no vitals filed for this visit.  There is no height or weight on file to calculate BMI.  Physical Exam Vitals reviewed.  Constitutional:  General: She is not in acute distress. Appearance: Normal appearance.  HENT:  Head: Normocephalic and atraumatic.  Right Ear: External ear normal.  Left Ear: External ear normal.  Nose: Nose normal.  Mouth/Throat:  Mouth: Mucous membranes are moist.  Pharynx: Oropharynx is clear.  Eyes:  General: No scleral icterus. Extraocular Movements: Extraocular movements intact.  Conjunctiva/sclera: Conjunctivae normal.  Pupils: Pupils are equal, round, and reactive to light.  Cardiovascular:  Rate and Rhythm: Normal rate and regular rhythm.  Pulses: Normal pulses.  Heart sounds: Normal heart sounds.  Pulmonary:  Effort: Pulmonary effort is normal. No respiratory distress.  Breath sounds: Normal breath sounds.  Abdominal:  General: Bowel sounds are normal.  Palpations: Abdomen is soft.  Tenderness: There is no abdominal tenderness.  Musculoskeletal:  General: No swelling, tenderness or deformity. Normal range of motion.  Cervical back: Normal range of motion and neck supple.  Skin: General: Skin is warm and dry.  Coloration: Skin is not jaundiced.  Neurological:  General: No focal deficit present.  Mental Status: She is alert and oriented to person, place, and time.  Psychiatric:  Mood and Affect: Mood normal.  Behavior: Behavior normal.     Breast: There are 2 palpable bruises in the central portion of the right breast. There is no palpable mass in the left breast. There is no palpable axillary, supraclavicular, or cervical lymphadenopathy.  Labs, Imaging and Diagnostic Testing:  Assessment and Plan:   Diagnoses and all orders for this  visit:  Malignant neoplasm of lower-outer quadrant of right breast of female, estrogen receptor negative    The patient appears to have 2 metaplastic breast cancers in the subareolar right breast. At this point I would recommend evaluating her with MRI to see if she is a candidate for lumpectomy and to decide if the nipple can be spared. She would also be a good candidate for sentinel node biopsy. She would prefer breast conservation if possible. I have discussed with her in detail the risks and benefits of the operation as well as some of the technical aspects including the use of radioactive seeds for localization and she understands and wishes to proceed. She will talk with medical oncology to decide if chemotherapy will be necessary. She will also meet with radiation oncology to discuss adjuvant therapy as well. We will proceed with surgical scheduling.   MRI showed much larger area. Plan for right mastectomy and sentinel node biopsy

## 2023-03-17 NOTE — Anesthesia Postprocedure Evaluation (Signed)
Anesthesia Post Note  Patient: Deanna Rose  Procedure(s) Performed: MASTECTOMY WITH SENTINEL LYMPH NODE BIOPSY (Right: Breast)     Patient location during evaluation: PACU Anesthesia Type: Regional and General Level of consciousness: awake and alert Pain management: pain level controlled Vital Signs Assessment: post-procedure vital signs reviewed and stable Respiratory status: spontaneous breathing, nonlabored ventilation, respiratory function stable and patient connected to nasal cannula oxygen Cardiovascular status: blood pressure returned to baseline and stable Postop Assessment: no apparent nausea or vomiting Anesthetic complications: no   No notable events documented.  Last Vitals:  Vitals:   03/17/23 1230 03/17/23 1245  BP: (!) 150/87 (!) 152/94  Pulse: (!) 102 (!) 101  Resp: 19 14  Temp:    SpO2: 95% 99%    Last Pain:  Vitals:   03/17/23 1245  TempSrc:   PainSc: 3                  Kennedy Brines S

## 2023-03-17 NOTE — Transfer of Care (Signed)
Immediate Anesthesia Transfer of Care Note  Patient: Deanna Rose  Procedure(s) Performed: MASTECTOMY WITH SENTINEL LYMPH NODE BIOPSY (Right: Breast)  Patient Location: PACU  Anesthesia Type:General and regional  Level of Consciousness: awake, drowsy, and patient cooperative  Airway & Oxygen Therapy: Patient Spontanous Breathing and Patient connected to face mask oxygen  Post-op Assessment: Report given to RN and Post -op Vital signs reviewed and stable  Post vital signs: Reviewed and stable  Last Vitals:  Vitals Value Taken Time  BP 154/86 03/17/23 1154  Temp 36.3 C 03/17/23 1154  Pulse 101 03/17/23 1156  Resp 16 03/17/23 1156  SpO2 100 % 03/17/23 1156  Vitals shown include unvalidated device data.  Last Pain:  Vitals:   03/17/23 0836  TempSrc: Oral  PainSc: 0-No pain      Patients Stated Pain Goal: 4 (03/17/23 0836)  Complications: No notable events documented.

## 2023-03-18 ENCOUNTER — Encounter (HOSPITAL_BASED_OUTPATIENT_CLINIC_OR_DEPARTMENT_OTHER): Payer: Self-pay | Admitting: General Surgery

## 2023-03-18 NOTE — Discharge Instructions (Signed)

## 2023-03-18 NOTE — Progress Notes (Signed)
1 Day Post-Op   Subjective/Chief Complaint: No complaints   Objective: Vital signs in last 24 hours: Temp:  [97.3 F (36.3 C)-98.8 F (37.1 C)] 98.8 F (37.1 C) (04/30 0730) Pulse Rate:  [70-106] 96 (04/30 0745) Resp:  [13-21] 16 (04/30 0730) BP: (119-167)/(61-110) 119/61 (04/30 0730) SpO2:  [94 %-100 %] 96 % (04/30 0745) Weight:  [61.9 kg] 61.9 kg (04/29 0836)    Intake/Output from previous day: 04/29 0701 - 04/30 0700 In: 1575 [P.O.:1175; I.V.:400] Out: 66 [Drains:70; Blood:15] Intake/Output this shift: Total I/O In: -  Out: 25 [Drains:25]  General appearance: alert and cooperative Resp: clear to auscultation bilaterally Chest wall: skin flaps look good Cardio: regular rate and rhythm GI: soft, non-tender; bowel sounds normal; no masses,  no organomegaly  Lab Results:  No results for input(s): "WBC", "HGB", "HCT", "PLT" in the last 72 hours. BMET No results for input(s): "NA", "K", "CL", "CO2", "GLUCOSE", "BUN", "CREATININE", "CALCIUM" in the last 72 hours. PT/INR No results for input(s): "LABPROT", "INR" in the last 72 hours. ABG No results for input(s): "PHART", "HCO3" in the last 72 hours.  Invalid input(s): "PCO2", "PO2"  Studies/Results: NM Sentinel Node Inj-No Rpt (Breast)  Result Date: 03/17/2023 Sulfur Colloid was injected by the Nuclear Medicine Technologist for sentinel lymph node localization.    Anti-infectives: Anti-infectives (From admission, onward)    Start     Dose/Rate Route Frequency Ordered Stop   03/17/23 0815  ceFAZolin (ANCEF) IVPB 2g/100 mL premix  Status:  Discontinued        2 g 200 mL/hr over 30 Minutes Intravenous On call to O.R. 03/17/23 1610 03/17/23 1300   03/17/23 0815  ceFAZolin (ANCEF) IVPB 2g/100 mL premix  Status:  Discontinued        2 g 200 mL/hr over 30 Minutes Intravenous On call to O.R. 03/17/23 0812 03/17/23 1300       Assessment/Plan: s/p Procedure(s): MASTECTOMY WITH SENTINEL LYMPH NODE BIOPSY  (Right) Advance diet Discharge Teach pt and family how to do drain care  LOS: 0 days    Chevis Pretty III 03/18/2023

## 2023-03-19 LAB — SURGICAL PATHOLOGY

## 2023-03-27 ENCOUNTER — Other Ambulatory Visit: Payer: Self-pay

## 2023-03-27 ENCOUNTER — Inpatient Hospital Stay: Payer: Medicare PPO | Attending: Hematology and Oncology | Admitting: Hematology and Oncology

## 2023-03-27 VITALS — BP 148/79 | HR 90 | Temp 98.4°F | Resp 16 | Wt 136.6 lb

## 2023-03-27 DIAGNOSIS — Z171 Estrogen receptor negative status [ER-]: Secondary | ICD-10-CM

## 2023-03-27 DIAGNOSIS — Z87891 Personal history of nicotine dependence: Secondary | ICD-10-CM | POA: Insufficient documentation

## 2023-03-27 DIAGNOSIS — C50511 Malignant neoplasm of lower-outer quadrant of right female breast: Secondary | ICD-10-CM | POA: Insufficient documentation

## 2023-03-27 DIAGNOSIS — Z79899 Other long term (current) drug therapy: Secondary | ICD-10-CM | POA: Diagnosis not present

## 2023-03-27 NOTE — Progress Notes (Signed)
[facility] CONSULT NOTE  Patient Care Team: Gwenyth Bender, MD as PCP - General (Internal Medicine) Rachel Moulds, MD as Consulting Physician (Hematology and Oncology) Dorothy Puffer, MD as Consulting Physician (Radiation Oncology) Griselda Miner, MD as Consulting Physician (General Surgery) Pershing Proud, RN as Oncology Nurse Navigator Donnelly Angelica, RN as Oncology Nurse Navigator  CHIEF COMPLAINTS/PURPOSE OF CONSULTATION:  Newly diagnosed breast cancer  HISTORY OF PRESENTING ILLNESS:  Deanna Rose 77 y.o. female is here because of recent diagnosis of right breast.  I reviewed her records extensively and collaborated the history with the patient.  SUMMARY OF ONCOLOGIC HISTORY: Oncology History  Malignant neoplasm of lower-outer quadrant of right breast of female, estrogen receptor negative (HCC)  01/28/2023 Mammogram   Bilateral diagnostic mammogram showed 2 indeterminate intraductal masses within the right breast potentially papillomas.  Alternative etiologies are not excluded.  Ultrasound-guided core needle biopsy of both right breast mass at 7 o'clock position 2 cm from the nipple and 7 o'clock position 1 cm from the nipple was recommended.   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  Similar pathology noted at right breast needle core biopsy 7:00 2 cm from the nipple.  Prognostics showed ER 0% negative PR 0% negative and Ki-67 of 60% from the first biopsy at 7:00 1 cm from the nipple.  HER2 was 1+ negative   02/11/2023 Initial Diagnosis   Malignant neoplasm of lower-outer quadrant of right breast of female, estrogen receptor negative (HCC)    This is a very pleasant 77 year old female patient with newly diagnosed right breast multifocal invasive ductal  carcinoma, metaplastic, triple negative referred to medical oncology.  She had right mastectomy which showed IDC with mucinous features areas of geographic  necrosis and focal chondroid metaplasia consistent with metaplastic  change, 4.5 x 4.0 x 2.5 cm, grade III/III  She is recovering well from her surgery.  She still has a drain in and has a postop appointment with Dr. Carolynne Edouard this coming Tuesday.  She arrived today to the appointment with her son.  No concerns reported.   MEDICAL HISTORY:  Past Medical History:  Diagnosis Date   Arthritis    Asthma    Blood clot in vein    leg   1966   Cancer (HCC)    UTERUS   1970S   GERD (gastroesophageal reflux disease)    Headache    Heart murmur    History of depression    without worsening   History of hiatal hernia    History of sickle cell trait    Hypertension    stable   Kidney disease    Low back pain    with degenerative disc disease by MRI   Mild obesity    Right flank pain    radicular   Stroke (HCC)    many years ago  1980s, left facial twitching takes botox     SURGICAL HISTORY: Past Surgical History:  Procedure Laterality Date   BILATERAL CARPAL TUNNEL RELEASE     BREAST BIOPSY Right 02/06/2023   [place] RT BREAST BX W LOC DEV 1ST LESION IMG BX SPEC US GUIDE 02/06/2023 [facility]   BREAST BIOPSY Right 02/06/2023   [place] RT BREAST BX W LOC DEV EA ADD LESION IMG BX SPEC US GUIDE 02/06/2023 [facility]  BREAST EXCISIONAL BIOPSY Left 1970   BREAST SURGERY     bil breast bx    2x each   GLAUCOMA SURGERY     LEG SURGERY     rt leg blood clot removed 1966  [facility]   MASTECTOMY W/ SENTINEL NODE BIOPSY Right 03/17/2023   Procedure: MASTECTOMY WITH SENTINEL LYMPH NODE BIOPSY;  Surgeon: Griselda Miner, MD;  Location: [facility];  Service: General;  Laterality: Right;   UTERINE FIBROID SURGERY     CANCER REMOVED    SOCIAL HISTORY: Social History   Socioeconomic History   Marital status: Divorced     Spouse name: Not on file   Number of children: 2   Years of education: Not on file   Highest education level: Not on file  Occupational History   Not on file  Tobacco Use   Smoking status: Former    Packs/day: 0.25    Years: 12.00    Additional pack years: 0.00    Total pack years: 3.00    Types: Cigarettes    Quit date: 1970    Years since quitting: 54.3   Smokeless tobacco: Never  Vaping Use   Vaping Use: Never used  Substance and Sexual Activity   Alcohol use: No   Drug use: No   Sexual activity: Not on file  Other Topics Concern   Not on file  Social History Narrative   Not on file   Social Determinants of Health   Financial Resource Strain: Not on file  Food Insecurity: No Food Insecurity (02/12/2023)   Hunger Vital Sign    Worried About Running Out of Food in the Last Year: Never true    Ran Out of Food in the Last Year: Never true  Transportation Needs: No Transportation Needs (02/12/2023)   PRAPARE - Administrator, Civil Service (Medical): No    Lack of Transportation (Non-Medical): No  Physical Activity: Not on file  Stress: Not on file  Social Connections: Not on file  Intimate Partner Violence: Not on file    FAMILY HISTORY: Family History  Problem Relation Age of Onset   Heart disease Mother    Hypertension Mother    Heart disease Father    Hypertension Father    Hypertension Sister    Heart disease Sister    Cancer Sister        unknown type   Thyroid cancer Daughter 22   Multiple myeloma Son 80    ALLERGIES:  is allergic to sulfa antibiotics.  MEDICATIONS:  Current Outpatient Medications  Medication Sig Dispense Refill   acetaminophen (TYLENOL) 500 MG tablet Take 1,000 mg by mouth every 8 (eight) hours as needed for mild pain or moderate pain.     amiloride-hydrochlorothiazide (MODURETIC) 5-50 MG tablet Take 1 tablet by mouth daily.     amLODipine (NORVASC) 5 MG tablet Take 5 mg by mouth daily.     b complex vitamins tablet  Take 1 tablet by mouth daily.     baclofen (LIORESAL) 10 MG tablet Take 10 mg by mouth 3 (three) times daily as needed for muscle spasms.      Calcium Carb-Cholecalciferol (CALCIUM + D3) 600-200 MG-UNIT TABS Take 1 tablet by mouth daily.     citalopram (CELEXA) 20 MG tablet Take 20 mg by mouth at bedtime.      fluticasone (FLOVENT HFA) 110 MCG/ACT inhaler Inhale 1 puff into the lungs 2 (two) times daily as needed.  gabapentin (NEURONTIN) 300 MG capsule Take 300 mg by mouth 2 (two) times daily.      magnesium oxide (MAG-OX) 400 (240 Mg) MG tablet Take 1 tablet by mouth 2 (two) times daily.     memantine (NAMENDA) 10 MG tablet Take 10 mg by mouth at bedtime.     methocarbamol (ROBAXIN) 500 MG tablet Take 1 tablet (500 mg total) by mouth every 6 (six) hours as needed for muscle spasms. 30 tablet 1   metoCLOPramide (REGLAN) 10 MG tablet Take 10 mg by mouth daily.     montelukast (SINGULAIR) 10 MG tablet Take 10 mg by mouth daily.     Multiple Vitamins-Minerals (CENTRUM SILVER PO) Take 1 tablet by mouth daily.      omeprazole (PRILOSEC) 40 MG capsule Take 40 mg by mouth daily.     oxyCODONE (ROXICODONE) 5 MG immediate release tablet Take 1 tablet (5 mg total) by mouth every 6 (six) hours as needed for severe pain. 10 tablet 0   rosuvastatin (CRESTOR) 5 MG tablet Take 5 mg by mouth at bedtime.     vitamin C (ASCORBIC ACID) 500 MG tablet Take 500 mg by mouth daily.     vitamin E 400 UNIT capsule Take 400 Units by mouth daily.     No current facility-administered medications for this visit.    REVIEW OF SYSTEMS:   Constitutional: Denies fevers, chills or abnormal night sweats Eyes: Denies blurriness of vision, double vision or watery eyes Ears, nose, mouth, throat, and face: Denies mucositis or sore throat Respiratory: Denies cough, dyspnea or wheezes Cardiovascular: Denies palpitation, chest discomfort or lower extremity swelling Gastrointestinal:  Denies nausea, heartburn or change in bowel  habits Skin: Denies abnormal skin rashes Lymphatics: Denies new lymphadenopathy or easy bruising Neurological:Denies numbness, tingling or new weaknesses Behavioral/Psych: Mood is stable, no new changes  Breast: Denies any palpable lumps or discharge All other systems were reviewed with the patient and are negative.  PHYSICAL EXAMINATION: ECOG PERFORMANCE STATUS: 2 - Symptomatic, <50% confined to bed  Vitals:   03/27/23 1256  BP: (!) 148/79  Pulse: 90  Resp: 16  Temp: 98.4 F (36.9 C)  SpO2: 97%   Filed Weights   03/27/23 1256  Weight: 136 lb 9.6 oz (62 kg)    GENERAL:alert, no distress and comfortable   LABORATORY DATA:  I have reviewed the data as listed Lab Results  Component Value Date   WBC 8.3 02/12/2023   HGB 12.6 02/12/2023   HCT 36.2 02/12/2023   MCV 87.4 02/12/2023   PLT 371 02/12/2023   Lab Results  Component Value Date   NA 138 03/14/2023   K 4.6 03/14/2023   CL 104 03/14/2023   CO2 22 03/14/2023    RADIOGRAPHIC STUDIES: I have personally reviewed the radiological reports and agreed with the findings in the report.  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  She appears to have at least 2 areas of abnormal findings on imaging.  We have reviewed the imaging findings and pathology results in detail today.  We have also reviewed this in the breast cancer multidisciplinary conference.  Given her age, despite she being high-grade and triple negative, it may be reasonable to proceed  with upfront surgery followed by consideration for adjuvant chemotherapy. During her last visit here, we have  discussed about some adjuvant chemotherapy options.  She is here after right breast mastectomy.  Final pathology showed metaplastic 4.5 cm tumor, negative margins, negative lymph nodes.  Today I once again discussed about the role for adjuvant chemo and how it reduces the risk of recurrence.  Given her borderline performance status and her age, she would like to defer adjuvant chemotherapy especially because her lymph nodes are negative.  I have also sent an in basket message to Dr. Tildon Husky to see if there is any role for adjuvant radiation. There is no role for antiestrogen therapy.  She wants to continue surveillance.  She will return to clinic in 6 months.  We had a clear discussion that even if she were to recur down the lane and have metastatic disease, she may not be any better candidate for treatment and she is today and we may have to focus on goals of care and they are agreeable to it.   Total time spent: 30 minutes including history, physical exam, review of records, counseling and coordination of care. All questions were answered. The patient knows to call the clinic with any problems, questions or concerns.    Rachel Moulds, MD 03/27/23

## 2023-03-27 NOTE — Assessment & Plan Note (Signed)
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  She appears to have at least 2 areas of abnormal findings on imaging.  We have reviewed the imaging findings and pathology results in detail today.  We have also reviewed this in the breast cancer multidisciplinary conference.  Given her age, despite she being high-grade and triple negative, it may be reasonable to proceed with upfront surgery followed by consideration for adjuvant chemotherapy. During her last visit here, we have discussed about some adjuvant chemotherapy options.  She is here after right breast mastectomy.  Final pathology showed metaplastic 4.5 cm tumor, negative margins, negative lymph nodes.  Today I once again discussed about the role for adjuvant chemo and how it reduces the risk of recurrence.  Given her borderline performance status and her age, she would like to defer adjuvant chemotherapy especially because her lymph nodes are negative.  I have also sent an in basket message to Dr. Tildon Husky to see if there is any role for adjuvant radiation. There is no role for antiestrogen therapy.  She wants to continue surveillance.  She will return to clinic in 6 months.  We had a clear discussion that even if she were to recur down the lane and have metastatic disease, she may not be any better candidate for treatment and she is today and we may have to focus on goals of care and they are agreeable to it.

## 2023-03-28 ENCOUNTER — Encounter: Payer: Self-pay | Admitting: *Deleted

## 2023-03-28 ENCOUNTER — Telehealth: Payer: Self-pay | Admitting: Hematology and Oncology

## 2023-03-28 DIAGNOSIS — Z171 Estrogen receptor negative status [ER-]: Secondary | ICD-10-CM

## 2023-03-28 NOTE — Telephone Encounter (Signed)
Spoke with patient confirming upcoming appointments  

## 2023-04-05 NOTE — Therapy (Signed)
OUTPATIENT PHYSICAL THERAPY BREAST CANCER POST OP FOLLOW UP   Patient Name: Deanna Rose MRN: [id number] DOB:07-16-1946, 77 y.o., female Today's Date: 04/07/2023  END OF SESSION:  PT End of Session - 04/07/23 0958     Visit Number 6    Number of Visits 9    Date for PT Re-Evaluation 04/09/23    Authorization - Visit Number 6    Authorization - Number of Visits 9    PT Start Time 1000    PT Stop Time 1055    PT Time Calculation (min) 55 min    Activity Tolerance Patient tolerated treatment well    Behavior During Therapy WFL for tasks assessed/performed             Past Medical History:  Diagnosis Date   Arthritis    Asthma    Blood clot in vein    leg   1966   Cancer (HCC)    UTERUS   1970S   GERD (gastroesophageal reflux disease)    Headache    Heart murmur    History of depression    without worsening   History of hiatal hernia    History of sickle cell trait    Hypertension    stable   Kidney disease    Low back pain    with degenerative disc disease by MRI   Mild obesity    Right flank pain    radicular   Stroke (HCC)    many years ago  1980s, left facial twitching takes botox    Past Surgical History:  Procedure Laterality Date   BILATERAL CARPAL TUNNEL RELEASE     BREAST BIOPSY Right 02/06/2023   [place] RT BREAST BX W LOC DEV 1ST LESION IMG BX SPEC US GUIDE 02/06/2023 [facility]   BREAST BIOPSY Right 02/06/2023   [place] RT BREAST BX W LOC DEV EA ADD LESION IMG BX SPEC US GUIDE 02/06/2023 [facility]   BREAST EXCISIONAL BIOPSY Left 1970   BREAST SURGERY     bil breast bx    2x each   GLAUCOMA SURGERY     LEG SURGERY     rt leg blood clot removed 1966  [facility]   MASTECTOMY W/ SENTINEL NODE BIOPSY Right 03/17/2023   Procedure: MASTECTOMY WITH SENTINEL LYMPH NODE BIOPSY;  Surgeon: Griselda Miner, MD;  Location: [facility];  Service: General;  Laterality: Right;   UTERINE FIBROID SURGERY     CANCER REMOVED   Patient Active  Problem List   Diagnosis Date Noted   Cancer of right female breast (HCC) 03/17/2023   Malignant neoplasm of lower-outer quadrant of right breast of female, estrogen receptor negative (HCC) 02/11/2023   LVH (left ventricular hypertrophy) 07/30/2022   Primary hypertension 07/30/2022   Coronary artery calcification seen on CAT scan 07/30/2022   Atelectasis 11/01/2021   Asthma 11/01/2021   Lumbar stenosis with neurogenic claudication 03/07/2016   Low back pain    Right flank pain    Hypertension    History of depression      REFERRING PROVIDER: Chevis Pretty III MD  REFERRING DIAG: Right Breast Cancer  THERAPY DIAG:  Malignant neoplasm of lower-outer quadrant of right breast of female, estrogen receptor negative (HCC)  Chronic right shoulder pain  Stiffness of right shoulder, not elsewhere classified  Abnormal posture  Rationale for Evaluation and Treatment: Rehabilitation  ONSET DATE: 01/28/2023  SUBJECTIVE:  SUBJECTIVE STATEMENT: Surgery went well. A little shoulder pain off and on in the shoulder, and sometimes where they removed the breast. I have the wrong bra on today. I haven't been wearing it since the surgery. She has not started doing the exercises yet. She has never seen an orthopedic MD.  PERTINENT HISTORY:  Patient was diagnosed on 01/28/2023 with right grade 3 invasive ductal carcinoma breast cancer. It measures 8 mm and 1.6 cm and is located in the lower outer quadrant. It is triple negative with a Ki67 of 60%. She has a history of uterine cancer from the 1970's. Her right shoulder pain began more than a year ago and she has not seen a doctor for this.  She is now s/p Right Mastectomy on 03/17/2023 with drain removed on 04/01/23. She had 0/4 LN's. She has declined chemotherapy and  radiationShe had a baseline evaluation and 3 other PT visits prior to surgery to prepare her for radiation and to decrease right shoulder pain.  PATIENT GOALS:  Reassess how my recovery is going related to arm function, pain, and swelling.  PAIN:  Are you having pain? No, off and on in the right shoulder  PRECAUTIONS: Recent Surgery, right UE Lymphedema risk, fall risk, prior CVA  ACTIVITY LEVEL / LEISURE: She does not exercise   OBJECTIVE:   PATIENT SURVEYS:  QUICK DASH: 34.09  OBSERVATIONS: Mastectomy incision healing well. Swelling noted at lateral trunk, and pt not wearing her compression bra. Tender at clavicular border of pectorals. Reminded about good posture  POSTURE:  Forward head and rounded shoulders posture   LYMPHEDEMA ASSESSMENT:  A/PROM RIGHT   eval   RIGHT  02/19/2023 Right  4/10 Right A/ROM (Deg) 02/28/23 RIGHT 04/07/23  Shoulder extension 65       65  Shoulder flexion 101 and painful 150  150 150 135 no pain  Shoulder abduction 140     140 145 mild  pain  Shoulder internal rotation 28 and painful         Shoulder external rotation 72 85     86                          (Blank rows = not tested)   A/PROM LEFT   eval Left A/ROM 02/28/23 LEFT 04/07/2023  Shoulder extension 58   58  Shoulder flexion 136 150 145  Shoulder abduction 138 140 145  Shoulder internal rotation 64     Shoulder external rotation 68                             (Blank rows = not tested)   CERVICAL AROM: All within normal limits:      Percent limited 02/28/23  Flexion WNL    Extension WNL    Right lateral flexion WNL 40  Left lateral flexion WNL 40  Right rotation 25% limited 55  Left rotation 25% limited 55     LYMPHEDEMA ASSESSMENTS:    LANDMARK RIGHT   eval RIGHT 04/07/23  10 cm proximal to olecranon process 28.2 27.3  Olecranon process 23.4 23.4  10 cm proximal to ulnar styloid process 22 21.2  Just proximal to ulnar styloid process 15.6 15.6  Across hand at thumb  web space 18.4 18.3  At base of 2nd digit 5.8 5.7  (Blank rows = not tested)   LANDMARK LEFT   eval LEFT 04/07/2023  10 cm  proximal to olecranon process 28.1 26.8  Olecranon process 23.1 22.8  10 cm proximal to ulnar styloid process 21.1 20.7  Just proximal to ulnar styloid process 15.8 15.5  Across hand at thumb web space 18 18.2  At base of 2nd digit 5.8 5.7  (Blank rows = not tested)      Surgery type/Date: 03/17/2023 Right Mastectomy with SLNB Number of lymph nodes removed: 0/4 Current/past treatment (chemo, radiation, hormone therapy): Declined Chemo and radiation Other symptoms:  Heaviness/tightness No Pain No Pitting edema No Infections No Decreased scar mobility Yes Stemmer sign No  PATIENT EDUCATION:  Education details: 4 post op exercises, ABC class today, SOZO screens, use of scapular depression and good posture to prevent shoulder pain, scar massage when incisions healed Person educated: Patient Education method: Explanation, Demonstration, and Handouts, verbal cues Education comprehension: verbalized understanding  HOME EXERCISE PROGRAM: Reviewed previously given post op HEP. Each exercise x 5, educated in scapular depression and not to force through shoulder pain  ASSESSMENT:  CLINICAL IMPRESSION: Pt is s.p right mastectomy with SLNB on 03/17/2023 with 0/4 LN. She has declined chemo and radiation. She is progressing quite well with good Shoulder ROM lacking only 15 degrees of  active shoulder flexion but able to do today without pain using scapular depression. Her incision is healing well, but not quite ready for scar massage. We reviewed all exercises and she did very well. She was reminded not to force through pain and if having pain to try pulling her shoulder/scapula down. She was set up for ABC class today while her son is with her, and also set up for SOZO screen. She was reminded to wear her compression bra for lateral trunk swelling and was given a foam  pad to wear at the axillary region of the bra to prevent soreness there. She was offered the ability to continue PT for several visits 1x per week, but after talking with her son she decided to try it at home herself first and if she feels she needs assistance they will contact us.   Pt will benefit from skilled therapeutic intervention to improve on the following deficits: Decreased knowledge of precautions, impaired UE functional use, pain, decreased ROM, postural dysfunction.   PT treatment/interventions: ADL/Self care home management, Therapeutic exercises, Patient/Family education, Self Care, scar mobilization, and Re-evaluation   GOALS: Goals reviewed with patient? Yes  LONG TERM GOALS:  (STG=LTG)  GOALS Name Target Date  Goal status  1 Pt will demonstrate she has regained full shoulder ROM and function post operatively compared to baselines.  Baseline: Mild limitation in right shoulder flexionwhen done with proper scapular depression but with no pain and full PROM  NOT MET  2     3     4         PLAN:  PT FREQUENCY/DURATION: pt has opted to continue on her own with assist of children, but will contact us if she feels she needs to return  PLAN FOR NEXT SESSION: Pt is discharged PHYSICAL THERAPY DISCHARGE SUMMARY  Visits from Start of Care: 6  Current functional level related to goals / functional outcomes: Did not fully achieve AROM goal;   Remaining deficits: Flexion AROM decreased by 15 degrees since prior to surgery but with PROM WNL and pt using good scapular depression to do without pain.   Education / Equipment: HEP   Patient agrees to discharge. Patient goals were not met. Patient is being discharged due to being pleased with the current  functional level.After speaking with her son Chiropodist) it was decided she wanted to work on it at home, but would contact us if she needs to come back.   [facility]  [address]  [place]  [address]  [phone]  After Breast Cancer Class It is recommended you attend the ABC class to be educated on lymphedema risk reduction. This class is free of charge and lasts for 1 hour. It is a 1-time class. You will need to download the TEAMS app either on your phone or computer. We will send you a link the night before or the morning of the class. You should be able to click on that link to join the class. This is not a confidential class. You don't have to turn your camera on, but other participants may be able to see your email address.  Scar massage You can begin gentle scar massage to you incision sites. Gently place one hand on the incision and move the skin (without sliding on the skin) in various directions. Do this for a few minutes and then you can gently massage either coconut oil or vitamin E cream into the scars.  Compression garment You should continue wearing your compression bra until you feel like you no longer have swelling.  Home exercise Program Continue doing the exercises you were given until you feel like you can do them without feeling any tightness at the end.   Walking Program Studies show that 30 minutes of walking per day (fast enough to elevate your heart rate) can significantly reduce the risk of a cancer recurrence. If you can't walk due to other medical reasons, we encourage you to find another activity you could do (like a stationary bike or water exercise).  Posture After breast cancer surgery, people frequently sit with rounded shoulders posture because it puts their incisions on slack and feels better. If you sit like this and scar tissue forms in that position, you can become very tight and have pain sitting or standing with good posture. Try to be aware of your posture and sit and stand up tall to heal properly.  Follow up PT: It is recommended you return every 3 months for the first 3 years following surgery to be assessed on the SOZO machine for an  L-Dex score. This helps prevent clinically significant lymphedema in 95% of patients. These follow up screens are 10 minute appointments that you are not billed for.  Waynette Buttery, PT 04/07/2023, 11:11 AM

## 2023-04-07 ENCOUNTER — Ambulatory Visit: Payer: Medicare PPO | Attending: General Surgery

## 2023-04-07 ENCOUNTER — Telehealth: Payer: Self-pay | Admitting: Licensed Clinical Social Worker

## 2023-04-07 DIAGNOSIS — G8929 Other chronic pain: Secondary | ICD-10-CM

## 2023-04-07 DIAGNOSIS — R293 Abnormal posture: Secondary | ICD-10-CM

## 2023-04-07 DIAGNOSIS — C50511 Malignant neoplasm of lower-outer quadrant of right female breast: Secondary | ICD-10-CM | POA: Diagnosis present

## 2023-04-07 DIAGNOSIS — M25611 Stiffness of right shoulder, not elsewhere classified: Secondary | ICD-10-CM

## 2023-04-07 DIAGNOSIS — Z171 Estrogen receptor negative status [ER-]: Secondary | ICD-10-CM | POA: Diagnosis present

## 2023-04-07 DIAGNOSIS — M25511 Pain in right shoulder: Secondary | ICD-10-CM | POA: Insufficient documentation

## 2023-04-07 NOTE — Telephone Encounter (Signed)
TC from pt's son with request for support for pt at this time with coping. Per son, she has been getting sick to her stomach before seeing doctors and has voiced being tired of going to appointments. Son has tried to support her and is planning to bring her to support group in June.  Son has let pt know that he has spoken with CSW and CSW will plan to call pt tomorrow.   Amia Rynders E Dalynn Jhaveri, LCSW

## 2023-04-07 NOTE — Patient Instructions (Signed)
     [facility]  [address], [address]  [place] [address]  [phone]  After Breast Cancer Class It is recommended you attend the ABC class to be educated on lymphedema risk reduction. This class is free of charge and lasts for 1 hour. It is a 1-time class. You will need to download the Teams app either on your phone or computer. We will send you a link the night before or the morning of the class. You should be able to click on that link to join the class. This is not a confidential class. You don't have to turn your camera on, but other participants may be able to see your email address.  Scar massage You can begin gentle scar massage to you incision sites. Gently place one hand on the incision and move the skin (without sliding on the skin) in various directions. Do this for a few minutes and then you can gently massage either coconut oil or vitamin E cream into the scars.  Compression garment You should continue wearing your compression bra until you feel like you no longer have swelling.  Home exercise Program Continue doing the exercises you were given until you feel like you can do them without feeling any tightness at the end.   Walking Program  Walk to mailbox and return to house per her son.  Posture After breast cancer surgery, people frequently sit with rounded shoulders posture because it puts their incisions on slack and feels better. If you sit like this and scar tissue forms in that position, you can become very tight and have pain sitting or standing with good posture. Try to be aware of your posture and sit and stand up tall to heal properly.  Follow up PT: It is recommended you return every 3 months for the first 2 years following surgery to be assessed on the SOZO machine for an L-Dex score. This helps prevent clinically significant lymphedema in 95% of patients. These follow up screens are 10 minute appointments that you are not billed  for.

## 2023-04-08 ENCOUNTER — Inpatient Hospital Stay: Payer: Medicare PPO | Admitting: Licensed Clinical Social Worker

## 2023-04-08 NOTE — Progress Notes (Signed)
[facility] Clinical Social Work  Clinical Social Work was referred by  pt's son  for assessment of psychosocial needs.  Clinical Social Worker contacted patient by phone to offer support and assess for needs.    CSW re-introduced self to patient. Patient has been feeling more nervous prior to doctor's appointments now and will vomit. She is also trying to adjust to seeing her mastectomy site and the loss of her breast. Pt does feel like overall she is adjusting with the support of her children and prayer.  CSW normalized feelings and provided brief psychoeducation on anxiety and the body.  Discussed strategies to help calm her nervous system such as deep breathing and grounding with five senses.  Pt voiced appreciation for the information.    CSW discussed additional supports including group, peer mentor, and counseling.  Pt agreed to referral for peer mentor/Alight guide. For counseling, pt took this CSW's information and stated that she will call as needed.     Nilton Lave E Jian Hodgman, LCSW  Clinical Social Worker [facility]

## 2023-04-28 NOTE — Telephone Encounter (Signed)
From patient.

## 2023-06-23 ENCOUNTER — Ambulatory Visit: Payer: Medicare PPO | Attending: General Surgery

## 2023-06-23 VITALS — Wt 133.1 lb

## 2023-06-23 DIAGNOSIS — Z483 Aftercare following surgery for neoplasm: Secondary | ICD-10-CM | POA: Insufficient documentation

## 2023-06-23 NOTE — Therapy (Signed)
OUTPATIENT PHYSICAL THERAPY SOZO SCREENING NOTE   Patient Name: Deanna Rose MRN: [id number] DOB:[birth year], 77 y.o., female Today's Date: 06/23/2023  PCP: Gwenyth Bender, MD REFERRING PROVIDER: Gwenyth Bender, MD   PT End of Session - 06/23/23 1519     Visit Number 5   # unchanged due to screen only   PT Start Time 1517    PT Stop Time 1522    PT Time Calculation (min) 5 min    Activity Tolerance Patient tolerated treatment well    Behavior During Therapy WFL for tasks assessed/performed             Past Medical History:  Diagnosis Date   Arthritis    Asthma    Blood clot in vein    leg   1966   Cancer (HCC)    UTERUS   1970S   GERD (gastroesophageal reflux disease)    Headache    Heart murmur    History of depression    without worsening   History of hiatal hernia    History of sickle cell trait    Hypertension    stable   Kidney disease    Low back pain    with degenerative disc disease by MRI   Mild obesity    Right flank pain    radicular   Stroke (HCC)    many years ago  1980s, left facial twitching takes botox    Past Surgical History:  Procedure Laterality Date   BILATERAL CARPAL TUNNEL RELEASE     BREAST BIOPSY Right 02/06/2023   [place] RT BREAST BX W LOC DEV 1ST LESION IMG BX SPEC US GUIDE 02/06/2023 [facility]   BREAST BIOPSY Right 02/06/2023   [place] RT BREAST BX W LOC DEV EA ADD LESION IMG BX SPEC US GUIDE 02/06/2023 [facility]   BREAST EXCISIONAL BIOPSY Left 1970   BREAST SURGERY     bil breast bx    2x each   GLAUCOMA SURGERY     LEG SURGERY     rt leg blood clot removed 1966  [facility]   MASTECTOMY W/ SENTINEL NODE BIOPSY Right 03/17/2023   Procedure: MASTECTOMY WITH SENTINEL LYMPH NODE BIOPSY;  Surgeon: Griselda Miner, MD;  Location: [facility];  Service: General;  Laterality: Right;   UTERINE FIBROID SURGERY     CANCER REMOVED   Patient Active Problem List   Diagnosis Date Noted   Cancer of right female breast  (HCC) 03/17/2023   Malignant neoplasm of lower-outer quadrant of right breast of female, estrogen receptor negative (HCC) 02/11/2023   LVH (left ventricular hypertrophy) 07/30/2022   Primary hypertension 07/30/2022   Coronary artery calcification seen on CAT scan 07/30/2022   Atelectasis 11/01/2021   Asthma 11/01/2021   Lumbar stenosis with neurogenic claudication 03/07/2016   Low back pain    Right flank pain    Hypertension    History of depression     REFERRING DIAG: right breast cancer at risk for lymphedema  THERAPY DIAG:  Aftercare following surgery for neoplasm  PERTINENT HISTORY: Patient was diagnosed on 01/28/2023 with right grade 3 invasive ductal carcinoma breast cancer. It measures 8 mm and 1.6 cm and is located in the lower outer quadrant. It is triple negative with a Ki67 of 60%. She has a history of uterine cancer from the 1970's. Her right shoulder pain began more than a year ago and she has not seen a doctor for this.  She is  now s/p Right Mastectomy on 03/17/2023 with drain removed on 04/01/23. She had 0/4 LN's. She has declined chemotherapy and radiationShe had a baseline evaluation and 3 other PT visits prior to surgery to prepare her for radiation and to decrease right shoulder pain.   PRECAUTIONS: right UE Lymphedema risk, None  SUBJECTIVE: Pt returns for her first 3 month L-Dex screen. "I'm still real tender at the top of my chest."   PAIN:  Are you having pain? No  SOZO SCREENING: Patient was assessed today using the SOZO machine to determine the lymphedema index score. This was compared to her baseline score. It was determined that she is within the recommended range when compared to her baseline and no further action is needed at this time. She will continue SOZO screenings. These are done every 3 months for 2 years post operatively followed by every 6 months for 2 years, and then annually.  Encouraged pt to be consistent with her HEP issued after surgery and add  gentle self massage to tight area. If this doesn't help to improve tightness she can return to physical therapy.      L-DEX FLOWSHEETS - 06/23/23 1500       L-DEX LYMPHEDEMA SCREENING   Measurement Type Unilateral    L-DEX MEASUREMENT EXTREMITY Upper Extremity    POSITION  Standing    DOMINANT SIDE Right    At Risk Side Right    BASELINE SCORE (UNILATERAL) 6.7    L-DEX SCORE (UNILATERAL) 7.8    VALUE CHANGE (UNILAT) 1.1               Hermenia Bers, PTA 06/23/2023, 3:23 PM

## 2023-07-31 ENCOUNTER — Ambulatory Visit: Payer: Medicare PPO | Admitting: Cardiology

## 2023-08-20 ENCOUNTER — Telehealth: Payer: Self-pay | Admitting: Hematology and Oncology

## 2023-08-20 NOTE — Telephone Encounter (Signed)
Left patient a message on both contacts regarding rescheduled appointment times/dates due to provider being out of office 09/29/2023; left callback number for scheduling team if needed for reschedule

## 2023-08-26 ENCOUNTER — Ambulatory Visit: Payer: Medicare PPO | Admitting: Cardiology

## 2023-08-28 ENCOUNTER — Ambulatory Visit: Payer: Self-pay | Admitting: Cardiology

## 2023-09-29 ENCOUNTER — Ambulatory Visit: Payer: Medicare PPO | Admitting: Hematology and Oncology

## 2023-09-29 ENCOUNTER — Ambulatory Visit: Payer: Medicare PPO | Attending: General Surgery

## 2023-09-29 DIAGNOSIS — Z483 Aftercare following surgery for neoplasm: Secondary | ICD-10-CM | POA: Insufficient documentation

## 2023-09-30 ENCOUNTER — Inpatient Hospital Stay: Payer: Medicare PPO | Attending: Hematology and Oncology | Admitting: Hematology and Oncology

## 2023-09-30 ENCOUNTER — Encounter: Payer: Self-pay | Admitting: Hematology and Oncology

## 2023-09-30 VITALS — BP 145/79 | HR 97 | Temp 97.5°F | Resp 16 | Wt 131.2 lb

## 2023-09-30 DIAGNOSIS — Z9011 Acquired absence of right breast and nipple: Secondary | ICD-10-CM | POA: Insufficient documentation

## 2023-09-30 DIAGNOSIS — Z171 Estrogen receptor negative status [ER-]: Secondary | ICD-10-CM | POA: Insufficient documentation

## 2023-09-30 DIAGNOSIS — C50511 Malignant neoplasm of lower-outer quadrant of right female breast: Secondary | ICD-10-CM | POA: Insufficient documentation

## 2023-09-30 NOTE — Progress Notes (Signed)
[facility] CONSULT NOTE  Patient Care Team: Gwenyth Bender, MD as PCP - General (Internal Medicine) Rachel Moulds, MD as Consulting Physician (Hematology and Oncology) Dorothy Puffer, MD as Consulting Physician (Radiation Oncology) Griselda Miner, MD as Consulting Physician (General Surgery) Pershing Proud, RN as Oncology Nurse Navigator Donnelly Angelica, RN as Oncology Nurse Navigator  CHIEF COMPLAINTS/PURPOSE OF CONSULTATION:  Newly diagnosed breast cancer  HISTORY OF PRESENTING ILLNESS:  Deanna Rose 77 y.o. female is here because of recent diagnosis of right breast.  I reviewed her records extensively and collaborated the history with the patient.  SUMMARY OF ONCOLOGIC HISTORY: Oncology History  Malignant neoplasm of lower-outer quadrant of right breast of female, estrogen receptor negative (HCC)  01/28/2023 Mammogram   Bilateral diagnostic mammogram showed 2 indeterminate intraductal masses within the right breast potentially papillomas.  Alternative etiologies are not excluded.  Ultrasound-guided core needle biopsy of both right breast mass at 7 o'clock position 2 cm from the nipple and 7 o'clock position 1 cm from the nipple was recommended.   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  Similar pathology noted at right breast needle core biopsy 7:00 2 cm from the nipple.  Prognostics showed ER 0% negative PR 0% negative and Ki-67 of 60% from the first biopsy at 7:00 1 cm from the nipple.  HER2 was 1+ negative   02/11/2023 Initial Diagnosis   Malignant neoplasm of lower-outer quadrant of right breast of female, estrogen receptor negative (HCC)    This is a very pleasant 77 year old female patient with newly diagnosed right breast multifocal invasive ductal  carcinoma, metaplastic, triple negative referred to medical oncology.  She had right mastectomy which showed IDC with mucinous features areas of geographic  necrosis and focal chondroid metaplasia consistent with metaplastic  change, 4.5 x 4.0 x 2.5 cm, grade III/III. After much discussion we have discussed about omitting chemo given her borderline performance status and advanced age.  She was not a candidate for adjuvant radiation.  Interval history Patient is here for a follow-up with her son.  She is here for a 58-month follow-up.  Since her last visit here, her son says she is not moving much and has some fall related allergies but otherwise has been doing well.  She had a follow-up visit with Dr. Carolynne Edouard, everything went well.  She is due for her next mammogram in spring 2025.  She denies any changes in her breast.  No change in breathing, bowel habits or urinary habits.  No new bone pains.  No new neurological complaints.  MEDICAL HISTORY:  Past Medical History:  Diagnosis Date   Arthritis    Asthma    Blood clot in vein    leg   1966   Cancer (HCC)    UTERUS   1970S   GERD (gastroesophageal reflux disease)    Headache    Heart murmur    History of depression    without worsening   History of hiatal hernia    History of sickle cell trait    Hypertension    stable   Kidney disease    Low back pain    with degenerative disc disease by MRI   Mild obesity    Right flank pain    radicular   Stroke (HCC)    many  years ago  1980s, left facial twitching takes botox     SURGICAL HISTORY: Past Surgical History:  Procedure Laterality Date   BILATERAL CARPAL TUNNEL RELEASE     BREAST BIOPSY Right 02/06/2023   [place] RT BREAST BX W LOC DEV 1ST LESION IMG BX SPEC US GUIDE 02/06/2023 [facility]   BREAST BIOPSY Right 02/06/2023   [place] RT BREAST BX W LOC DEV EA ADD LESION IMG BX SPEC US GUIDE 02/06/2023 [facility]   BREAST EXCISIONAL BIOPSY Left 1970   BREAST SURGERY     bil  breast bx    2x each   GLAUCOMA SURGERY     LEG SURGERY     rt leg blood clot removed 1966  [facility]   MASTECTOMY W/ SENTINEL NODE BIOPSY Right 03/17/2023   Procedure: MASTECTOMY WITH SENTINEL LYMPH NODE BIOPSY;  Surgeon: Griselda Miner, MD;  Location: [facility];  Service: General;  Laterality: Right;   UTERINE FIBROID SURGERY     CANCER REMOVED    SOCIAL HISTORY: Social History   Socioeconomic History   Marital status: Divorced    Spouse name: Not on file   Number of children: 2   Years of education: Not on file   Highest education level: Not on file  Occupational History   Not on file  Tobacco Use   Smoking status: Former    Current packs/day: 0.00    Average packs/day: 0.3 packs/day for 12.0 years (3.0 ttl pk-yrs)    Types: Cigarettes    Start date: 68    Quit date: 40    Years since quitting: 54.9   Smokeless tobacco: Never  Vaping Use   Vaping status: Never Used  Substance and Sexual Activity   Alcohol use: No   Drug use: No   Sexual activity: Not on file  Other Topics Concern   Not on file  Social History Narrative   Not on file   Social Determinants of Health   Financial Resource Strain: Not on file  Food Insecurity: No Food Insecurity (02/12/2023)   Hunger Vital Sign    Worried About Running Out of Food in the Last Year: Never true    Ran Out of Food in the Last Year: Never true  Transportation Needs: No Transportation Needs (02/12/2023)   PRAPARE - Administrator, Civil Service (Medical): No    Lack of Transportation (Non-Medical): No  Physical Activity: Not on file  Stress: Not on file  Social Connections: Unknown (03/31/2022)   [facility] from [facility]   Social Network    Social Network: Not on file  Intimate Partner Violence: Unknown (02/21/2022)   Received from [facility], [facility]   HITS    Physically Hurt: Not on file    Insult or Talk Down To: Not on file    Threaten Physical Harm: Not on  file    Scream or Curse: Not on file    FAMILY HISTORY: Family History  Problem Relation Age of Onset   Heart disease Mother    Hypertension Mother    Heart disease Father    Hypertension Father    Hypertension Sister    Heart disease Sister    Cancer Sister        unknown type   Thyroid cancer Daughter 88   Multiple myeloma Son 42    ALLERGIES:  is allergic to sulfa antibiotics.  MEDICATIONS:  Current Outpatient Medications  Medication Sig Dispense Refill  acetaminophen (TYLENOL) 500 MG tablet Take 1,000 mg by mouth every 8 (eight) hours as needed for mild pain or moderate pain.     amiloride-hydrochlorothiazide (MODURETIC) 5-50 MG tablet Take 1 tablet by mouth daily.     amLODipine (NORVASC) 5 MG tablet Take 5 mg by mouth daily.     b complex vitamins tablet Take 1 tablet by mouth daily.     baclofen (LIORESAL) 10 MG tablet Take 10 mg by mouth 3 (three) times daily as needed for muscle spasms.      Calcium Carb-Cholecalciferol (CALCIUM + D3) 600-200 MG-UNIT TABS Take 1 tablet by mouth daily.     citalopram (CELEXA) 20 MG tablet Take 20 mg by mouth at bedtime.      fluticasone (FLOVENT HFA) 110 MCG/ACT inhaler Inhale 1 puff into the lungs 2 (two) times daily as needed.     gabapentin (NEURONTIN) 300 MG capsule Take 300 mg by mouth 2 (two) times daily.      magnesium oxide (MAG-OX) 400 (240 Mg) MG tablet Take 1 tablet by mouth 2 (two) times daily.     memantine (NAMENDA) 10 MG tablet Take 10 mg by mouth at bedtime.     methocarbamol (ROBAXIN) 500 MG tablet Take 1 tablet (500 mg total) by mouth every 6 (six) hours as needed for muscle spasms. 30 tablet 1   metoCLOPramide (REGLAN) 10 MG tablet Take 10 mg by mouth daily.     montelukast (SINGULAIR) 10 MG tablet Take 10 mg by mouth daily.     Multiple Vitamins-Minerals (CENTRUM SILVER PO) Take 1 tablet by mouth daily.      omeprazole (PRILOSEC) 40 MG capsule Take 40 mg by mouth daily.     oxyCODONE (ROXICODONE) 5 MG immediate  release tablet Take 1 tablet (5 mg total) by mouth every 6 (six) hours as needed for severe pain. 10 tablet 0   rosuvastatin (CRESTOR) 5 MG tablet Take 5 mg by mouth at bedtime.     vitamin C (ASCORBIC ACID) 500 MG tablet Take 500 mg by mouth daily.     vitamin E 400 UNIT capsule Take 400 Units by mouth daily.     No current facility-administered medications for this visit.    REVIEW OF SYSTEMS:   Constitutional: Denies fevers, chills or abnormal night sweats Eyes: Denies blurriness of vision, double vision or watery eyes Ears, nose, mouth, throat, and face: Denies mucositis or sore throat Respiratory: Denies cough, dyspnea or wheezes Cardiovascular: Denies palpitation, chest discomfort or lower extremity swelling Gastrointestinal:  Denies nausea, heartburn or change in bowel habits Skin: Denies abnormal skin rashes Lymphatics: Denies new lymphadenopathy or easy bruising Neurological:Denies numbness, tingling or new weaknesses Behavioral/Psych: Mood is stable, no new changes  Breast: Denies any palpable lumps or discharge All other systems were reviewed with the patient and are negative.  PHYSICAL EXAMINATION: ECOG PERFORMANCE STATUS: 2 - Symptomatic, <50% confined to bed  Vitals:   09/30/23 1550  BP: (!) 145/79  Pulse: 97  Resp: 16  Temp: (!) 97.5 F (36.4 C)  SpO2: 99%   Filed Weights   09/30/23 1550  Weight: 131 lb 3.2 oz (59.5 kg)    GENERAL:alert, no distress and comfortable Breast: She is status post mastectomy on the right.  No concern for local recurrence.  Left breast normal to inspection and palpation.  No regional adenopathy Chest: Clear to auscultation bilaterally No lower extremity edema  LABORATORY DATA:  I have reviewed the data as listed Lab Results  Component  Value Date   WBC 8.3 02/12/2023   HGB 12.6 02/12/2023   HCT 36.2 02/12/2023   MCV 87.4 02/12/2023   PLT 371 02/12/2023   Lab Results  Component Value Date   NA 138 03/14/2023   K 4.6  03/14/2023   CL 104 03/14/2023   CO2 22 03/14/2023    RADIOGRAPHIC STUDIES: I have personally reviewed the radiological reports and agreed with the findings in the report.  ASSESSMENT AND PLAN:  No problem-specific Assessment & Plan notes found for this encounter.   Total time spent: 30 minutes including history, physical exam, review of records, counseling and coordination of care. All questions were answered. The patient knows to call the clinic with any problems, questions or concerns.    Rachel Moulds, MD 09/30/23

## 2023-09-30 NOTE — Assessment & Plan Note (Signed)
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  She appears to have at least 2 areas of abnormal findings on imaging.  We have reviewed the imaging findings and pathology results in detail today.  We have also reviewed this in the breast cancer multidisciplinary conference.  Given her age, despite she being high-grade and triple negative, it may be reasonable to proceed with upfront surgery followed by consideration for adjuvant chemotherapy. After much discussion, given her age, borderline performance status, decision was made to defer adjuvant chemo.  Given tumor size under 5 cm and no evidence of lymph node involvement and negative margins, she did not need postmastectomy radiation.  She is here for her 43-month follow-up.  She is doing well without any concerns for recurrence based on review of systems or physical exam.  I have today discussed about guardant reveal, tumor CT DNA testing and she is interested, provided the pamphlet to schedule the blood work.  She was encouraged to contact us sooner with any new questions or concerns.  Otherwise encouraged healthy diet, regular exercise and follow-up with [place] in 6 months again.

## 2023-10-29 ENCOUNTER — Ambulatory Visit: Payer: Medicare PPO | Admitting: Cardiology

## 2023-11-21 ENCOUNTER — Ambulatory Visit
Admission: RE | Admit: 2023-11-21 | Discharge: 2023-11-21 | Disposition: A | Discharge status: Home / Self Care | Payer: Medicare PPO | Source: Ambulatory Visit | Attending: Hematology and Oncology | Admitting: Hematology and Oncology

## 2023-11-21 DIAGNOSIS — C50511 Malignant neoplasm of lower-outer quadrant of right female breast: Secondary | ICD-10-CM

## 2023-12-05 DIAGNOSIS — E782 Mixed hyperlipidemia: Secondary | ICD-10-CM | POA: Diagnosis not present

## 2023-12-05 DIAGNOSIS — F32A Depression, unspecified: Secondary | ICD-10-CM | POA: Diagnosis not present

## 2023-12-05 DIAGNOSIS — J3089 Other allergic rhinitis: Secondary | ICD-10-CM | POA: Diagnosis not present

## 2023-12-05 DIAGNOSIS — H81391 Other peripheral vertigo, right ear: Secondary | ICD-10-CM | POA: Diagnosis not present

## 2023-12-05 DIAGNOSIS — M170 Bilateral primary osteoarthritis of knee: Secondary | ICD-10-CM | POA: Diagnosis not present

## 2023-12-05 DIAGNOSIS — Z9011 Acquired absence of right breast and nipple: Secondary | ICD-10-CM | POA: Diagnosis not present

## 2023-12-05 DIAGNOSIS — J300 Vasomotor rhinitis: Secondary | ICD-10-CM | POA: Diagnosis not present

## 2023-12-05 DIAGNOSIS — R4181 Age-related cognitive decline: Secondary | ICD-10-CM | POA: Diagnosis not present

## 2023-12-05 DIAGNOSIS — I119 Hypertensive heart disease without heart failure: Secondary | ICD-10-CM | POA: Diagnosis not present

## 2023-12-15 ENCOUNTER — Ambulatory Visit: Payer: Medicare PPO | Attending: General Surgery

## 2023-12-15 VITALS — Wt 130.2 lb

## 2023-12-15 DIAGNOSIS — Z483 Aftercare following surgery for neoplasm: Secondary | ICD-10-CM | POA: Insufficient documentation

## 2023-12-15 NOTE — Therapy (Signed)
OUTPATIENT PHYSICAL THERAPY SOZO SCREENING NOTE   Patient Name: Deanna Rose MRN: [id number] DOB:05/25/1946, 78 y.o., female Today's Date: 12/15/2023  PCP: Gwenyth Bender, MD REFERRING PROVIDER: Griselda Miner, MD   PT End of Session - 12/15/23 1704     Visit Number 5   # unchanged due to screen only   PT Start Time 1701    PT Stop Time 1708    PT Time Calculation (min) 7 min    Activity Tolerance Patient tolerated treatment well    Behavior During Therapy WFL for tasks assessed/performed             Past Medical History:  Diagnosis Date   Arthritis    Asthma    Blood clot in vein    leg   1966   Cancer (HCC)    UTERUS   1970S   GERD (gastroesophageal reflux disease)    Headache    Heart murmur    History of depression    without worsening   History of hiatal hernia    History of sickle cell trait    Hypertension    stable   Kidney disease    Low back pain    with degenerative disc disease by MRI   Mild obesity    Right flank pain    radicular   Stroke (HCC)    many years ago  1980s, left facial twitching takes botox    Past Surgical History:  Procedure Laterality Date   BILATERAL CARPAL TUNNEL RELEASE     BREAST BIOPSY Right 02/06/2023   [place] RT BREAST BX W LOC DEV 1ST LESION IMG BX SPEC US GUIDE 02/06/2023 [facility]   BREAST BIOPSY Right 02/06/2023   [place] RT BREAST BX W LOC DEV EA ADD LESION IMG BX SPEC US GUIDE 02/06/2023 [facility]   BREAST EXCISIONAL BIOPSY Left 1970   BREAST SURGERY     bil breast bx    2x each   GLAUCOMA SURGERY     LEG SURGERY     rt leg blood clot removed 1966  [facility]   MASTECTOMY W/ SENTINEL NODE BIOPSY Right 03/17/2023   Procedure: MASTECTOMY WITH SENTINEL LYMPH NODE BIOPSY;  Surgeon: Griselda Miner, MD;  Location: [facility];  Service: General;  Laterality: Right;   UTERINE FIBROID SURGERY     CANCER REMOVED   Patient Active Problem List   Diagnosis Date Noted   Cancer of right female  breast (HCC) 03/17/2023   Malignant neoplasm of lower-outer quadrant of right breast of female, estrogen receptor negative (HCC) 02/11/2023   LVH (left ventricular hypertrophy) 07/30/2022   Primary hypertension 07/30/2022   Coronary artery calcification seen on CAT scan 07/30/2022   Atelectasis 11/01/2021   Asthma 11/01/2021   Lumbar stenosis with neurogenic claudication 03/07/2016   Low back pain    Right flank pain    Hypertension    History of depression     REFERRING DIAG: right breast cancer at risk for lymphedema  THERAPY DIAG:  Aftercare following surgery for neoplasm  PERTINENT HISTORY: Patient was diagnosed on 01/28/2023 with right grade 3 invasive ductal carcinoma breast cancer. It measures 8 mm and 1.6 cm and is located in the lower outer quadrant. It is triple negative with a Ki67 of 60%. She has a history of uterine cancer from the 1970's. Her right shoulder pain began more than a year ago and she has not seen a doctor for this.  She is  now s/p Right Mastectomy on 03/17/2023 with drain removed on 04/01/23. She had 0/4 LN's. She has declined chemotherapy and radiationShe had a baseline evaluation and 3 other PT visits prior to surgery to prepare her for radiation and to decrease right shoulder pain.   PRECAUTIONS: right UE Lymphedema risk, None  SUBJECTIVE: Pt returns for her first 3 month L-Dex screen.    PAIN:  Are you having pain? No  SOZO SCREENING: Patient was assessed today using the SOZO machine to determine the lymphedema index score. This was compared to her baseline score. It was determined that she is within the recommended range when compared to her baseline and no further action is needed at this time. She will continue SOZO screenings. These are done every 3 months for 2 years post operatively followed by every 6 months for 2 years, and then annually.      L-DEX FLOWSHEETS - 12/15/23 1700       L-DEX LYMPHEDEMA SCREENING   Measurement Type Unilateral     L-DEX MEASUREMENT EXTREMITY Upper Extremity    POSITION  Standing    DOMINANT SIDE Right    At Risk Side Right    BASELINE SCORE (UNILATERAL) 6.7    L-DEX SCORE (UNILATERAL) 2.8    VALUE CHANGE (UNILAT) -3.9               Hermenia Bers, PTA 12/15/2023, 5:08 PM

## 2024-01-16 ENCOUNTER — Encounter: Payer: Self-pay | Admitting: Cardiology

## 2024-01-16 DIAGNOSIS — H6123 Impacted cerumen, bilateral: Secondary | ICD-10-CM | POA: Diagnosis not present

## 2024-01-16 DIAGNOSIS — R0981 Nasal congestion: Secondary | ICD-10-CM | POA: Diagnosis not present

## 2024-01-30 ENCOUNTER — Ambulatory Visit
Admission: RE | Admit: 2024-01-30 | Discharge: 2024-01-30 | Disposition: A | Discharge status: Home / Self Care | Payer: Medicare PPO | Source: Ambulatory Visit | Attending: Hematology and Oncology | Admitting: Hematology and Oncology

## 2024-01-30 DIAGNOSIS — Z1231 Encounter for screening mammogram for malignant neoplasm of breast: Secondary | ICD-10-CM | POA: Diagnosis not present

## 2024-02-06 ENCOUNTER — Ambulatory Visit: Payer: Medicare PPO | Admitting: Cardiology

## 2024-02-09 DIAGNOSIS — R0981 Nasal congestion: Secondary | ICD-10-CM | POA: Diagnosis not present

## 2024-02-09 DIAGNOSIS — H6122 Impacted cerumen, left ear: Secondary | ICD-10-CM | POA: Diagnosis not present

## 2024-02-09 DIAGNOSIS — H93293 Other abnormal auditory perceptions, bilateral: Secondary | ICD-10-CM | POA: Diagnosis not present

## 2024-02-26 DIAGNOSIS — R7303 Prediabetes: Secondary | ICD-10-CM | POA: Diagnosis not present

## 2024-02-26 DIAGNOSIS — I119 Hypertensive heart disease without heart failure: Secondary | ICD-10-CM | POA: Diagnosis not present

## 2024-02-26 DIAGNOSIS — N1831 Chronic kidney disease, stage 3a: Secondary | ICD-10-CM | POA: Diagnosis not present

## 2024-02-26 DIAGNOSIS — J3089 Other allergic rhinitis: Secondary | ICD-10-CM | POA: Diagnosis not present

## 2024-02-26 DIAGNOSIS — R4181 Age-related cognitive decline: Secondary | ICD-10-CM | POA: Diagnosis not present

## 2024-02-26 DIAGNOSIS — M170 Bilateral primary osteoarthritis of knee: Secondary | ICD-10-CM | POA: Diagnosis not present

## 2024-02-26 DIAGNOSIS — F32A Depression, unspecified: Secondary | ICD-10-CM | POA: Diagnosis not present

## 2024-02-26 DIAGNOSIS — J300 Vasomotor rhinitis: Secondary | ICD-10-CM | POA: Diagnosis not present

## 2024-02-26 DIAGNOSIS — A881 Epidemic vertigo: Secondary | ICD-10-CM | POA: Diagnosis not present

## 2024-02-26 DIAGNOSIS — E782 Mixed hyperlipidemia: Secondary | ICD-10-CM | POA: Diagnosis not present

## 2024-02-26 DIAGNOSIS — Z9011 Acquired absence of right breast and nipple: Secondary | ICD-10-CM | POA: Diagnosis not present

## 2024-02-26 DIAGNOSIS — H81391 Other peripheral vertigo, right ear: Secondary | ICD-10-CM | POA: Diagnosis not present

## 2024-03-01 DIAGNOSIS — K219 Gastro-esophageal reflux disease without esophagitis: Secondary | ICD-10-CM | POA: Diagnosis not present

## 2024-03-01 DIAGNOSIS — J309 Allergic rhinitis, unspecified: Secondary | ICD-10-CM | POA: Diagnosis not present

## 2024-03-15 ENCOUNTER — Ambulatory Visit: Payer: Medicare PPO

## 2024-03-30 ENCOUNTER — Ambulatory Visit: Payer: Medicare PPO | Admitting: Hematology and Oncology

## 2024-04-05 ENCOUNTER — Encounter (INDEPENDENT_AMBULATORY_CARE_PROVIDER_SITE_OTHER): Payer: Self-pay | Admitting: Ophthalmology

## 2024-04-05 DIAGNOSIS — H353132 Nonexudative age-related macular degeneration, bilateral, intermediate dry stage: Secondary | ICD-10-CM

## 2024-04-05 DIAGNOSIS — H35033 Hypertensive retinopathy, bilateral: Secondary | ICD-10-CM

## 2024-04-05 DIAGNOSIS — I10 Essential (primary) hypertension: Secondary | ICD-10-CM | POA: Diagnosis not present

## 2024-04-05 DIAGNOSIS — H43813 Vitreous degeneration, bilateral: Secondary | ICD-10-CM | POA: Diagnosis not present

## 2024-04-15 DIAGNOSIS — I129 Hypertensive chronic kidney disease with stage 1 through stage 4 chronic kidney disease, or unspecified chronic kidney disease: Secondary | ICD-10-CM | POA: Diagnosis not present

## 2024-04-15 DIAGNOSIS — J45909 Unspecified asthma, uncomplicated: Secondary | ICD-10-CM | POA: Diagnosis not present

## 2024-04-15 DIAGNOSIS — N1831 Chronic kidney disease, stage 3a: Secondary | ICD-10-CM | POA: Diagnosis not present

## 2024-04-15 DIAGNOSIS — G629 Polyneuropathy, unspecified: Secondary | ICD-10-CM | POA: Diagnosis not present

## 2024-04-15 DIAGNOSIS — E785 Hyperlipidemia, unspecified: Secondary | ICD-10-CM | POA: Diagnosis not present

## 2024-04-15 DIAGNOSIS — K219 Gastro-esophageal reflux disease without esophagitis: Secondary | ICD-10-CM | POA: Diagnosis not present

## 2024-04-15 DIAGNOSIS — R269 Unspecified abnormalities of gait and mobility: Secondary | ICD-10-CM | POA: Diagnosis not present

## 2024-04-15 DIAGNOSIS — D573 Sickle-cell trait: Secondary | ICD-10-CM | POA: Diagnosis not present

## 2024-04-15 DIAGNOSIS — F0393 Unspecified dementia, unspecified severity, with mood disturbance: Secondary | ICD-10-CM | POA: Diagnosis not present

## 2024-04-19 DIAGNOSIS — J309 Allergic rhinitis, unspecified: Secondary | ICD-10-CM | POA: Diagnosis not present

## 2024-04-19 DIAGNOSIS — J329 Chronic sinusitis, unspecified: Secondary | ICD-10-CM | POA: Diagnosis not present

## 2024-05-19 DIAGNOSIS — J3089 Other allergic rhinitis: Secondary | ICD-10-CM | POA: Diagnosis not present

## 2024-05-19 DIAGNOSIS — M170 Bilateral primary osteoarthritis of knee: Secondary | ICD-10-CM | POA: Diagnosis not present

## 2024-05-19 DIAGNOSIS — R7303 Prediabetes: Secondary | ICD-10-CM | POA: Diagnosis not present

## 2024-05-19 DIAGNOSIS — J300 Vasomotor rhinitis: Secondary | ICD-10-CM | POA: Diagnosis not present

## 2024-05-19 DIAGNOSIS — J0111 Acute recurrent frontal sinusitis: Secondary | ICD-10-CM | POA: Diagnosis not present

## 2024-05-19 DIAGNOSIS — N1831 Chronic kidney disease, stage 3a: Secondary | ICD-10-CM | POA: Diagnosis not present

## 2024-05-19 DIAGNOSIS — I119 Hypertensive heart disease without heart failure: Secondary | ICD-10-CM | POA: Diagnosis not present

## 2024-05-19 DIAGNOSIS — E782 Mixed hyperlipidemia: Secondary | ICD-10-CM | POA: Diagnosis not present

## 2024-05-19 DIAGNOSIS — R4181 Age-related cognitive decline: Secondary | ICD-10-CM | POA: Diagnosis not present

## 2024-06-01 DIAGNOSIS — J0111 Acute recurrent frontal sinusitis: Secondary | ICD-10-CM | POA: Diagnosis not present

## 2024-06-01 DIAGNOSIS — I119 Hypertensive heart disease without heart failure: Secondary | ICD-10-CM | POA: Diagnosis not present

## 2024-06-01 DIAGNOSIS — J300 Vasomotor rhinitis: Secondary | ICD-10-CM | POA: Diagnosis not present

## 2024-06-01 DIAGNOSIS — R4181 Age-related cognitive decline: Secondary | ICD-10-CM | POA: Diagnosis not present

## 2024-06-01 DIAGNOSIS — M170 Bilateral primary osteoarthritis of knee: Secondary | ICD-10-CM | POA: Diagnosis not present

## 2024-06-01 DIAGNOSIS — R7303 Prediabetes: Secondary | ICD-10-CM | POA: Diagnosis not present

## 2024-06-01 DIAGNOSIS — J3089 Other allergic rhinitis: Secondary | ICD-10-CM | POA: Diagnosis not present

## 2024-06-01 DIAGNOSIS — N1831 Chronic kidney disease, stage 3a: Secondary | ICD-10-CM | POA: Diagnosis not present

## 2024-07-05 ENCOUNTER — Encounter (INDEPENDENT_AMBULATORY_CARE_PROVIDER_SITE_OTHER): Admitting: Ophthalmology

## 2024-07-13 DIAGNOSIS — J0111 Acute recurrent frontal sinusitis: Secondary | ICD-10-CM | POA: Diagnosis not present

## 2024-07-13 DIAGNOSIS — Z23 Encounter for immunization: Secondary | ICD-10-CM | POA: Diagnosis not present

## 2024-07-13 DIAGNOSIS — N1831 Chronic kidney disease, stage 3a: Secondary | ICD-10-CM | POA: Diagnosis not present

## 2024-07-13 DIAGNOSIS — I119 Hypertensive heart disease without heart failure: Secondary | ICD-10-CM | POA: Diagnosis not present

## 2024-07-13 DIAGNOSIS — J300 Vasomotor rhinitis: Secondary | ICD-10-CM | POA: Diagnosis not present

## 2024-07-13 DIAGNOSIS — M170 Bilateral primary osteoarthritis of knee: Secondary | ICD-10-CM | POA: Diagnosis not present

## 2024-07-13 DIAGNOSIS — R7303 Prediabetes: Secondary | ICD-10-CM | POA: Diagnosis not present

## 2024-07-13 DIAGNOSIS — J3089 Other allergic rhinitis: Secondary | ICD-10-CM | POA: Diagnosis not present

## 2024-07-13 DIAGNOSIS — R4181 Age-related cognitive decline: Secondary | ICD-10-CM | POA: Diagnosis not present

## 2024-07-13 DIAGNOSIS — J4540 Moderate persistent asthma, uncomplicated: Secondary | ICD-10-CM | POA: Diagnosis not present

## 2024-07-29 ENCOUNTER — Encounter (INDEPENDENT_AMBULATORY_CARE_PROVIDER_SITE_OTHER): Admitting: Ophthalmology

## 2024-09-16 DIAGNOSIS — J3089 Other allergic rhinitis: Secondary | ICD-10-CM | POA: Diagnosis not present

## 2024-09-16 DIAGNOSIS — R4181 Age-related cognitive decline: Secondary | ICD-10-CM | POA: Diagnosis not present

## 2024-09-16 DIAGNOSIS — N1831 Chronic kidney disease, stage 3a: Secondary | ICD-10-CM | POA: Diagnosis not present

## 2024-09-16 DIAGNOSIS — R7303 Prediabetes: Secondary | ICD-10-CM | POA: Diagnosis not present

## 2024-09-16 DIAGNOSIS — K2100 Gastro-esophageal reflux disease with esophagitis, without bleeding: Secondary | ICD-10-CM | POA: Diagnosis not present

## 2024-09-16 DIAGNOSIS — I119 Hypertensive heart disease without heart failure: Secondary | ICD-10-CM | POA: Diagnosis not present

## 2024-09-16 DIAGNOSIS — J300 Vasomotor rhinitis: Secondary | ICD-10-CM | POA: Diagnosis not present

## 2024-09-16 DIAGNOSIS — Z23 Encounter for immunization: Secondary | ICD-10-CM | POA: Diagnosis not present

## 2024-09-16 DIAGNOSIS — M170 Bilateral primary osteoarthritis of knee: Secondary | ICD-10-CM | POA: Diagnosis not present

## 2024-09-28 ENCOUNTER — Telehealth: Payer: Self-pay

## 2024-09-28 NOTE — Telephone Encounter (Signed)
 Left message on voicemail about upcoming appointment on 11/12

## 2024-09-29 ENCOUNTER — Inpatient Hospital Stay: Payer: Medicare PPO | Attending: Hematology and Oncology | Admitting: Hematology and Oncology

## 2024-09-29 VITALS — BP 143/63 | HR 107 | Temp 98.6°F | Resp 18 | Wt 150.5 lb

## 2024-09-29 DIAGNOSIS — Z87891 Personal history of nicotine dependence: Secondary | ICD-10-CM | POA: Insufficient documentation

## 2024-09-29 DIAGNOSIS — R0789 Other chest pain: Secondary | ICD-10-CM | POA: Diagnosis not present

## 2024-09-29 DIAGNOSIS — C50511 Malignant neoplasm of lower-outer quadrant of right female breast: Secondary | ICD-10-CM

## 2024-09-29 DIAGNOSIS — Z853 Personal history of malignant neoplasm of breast: Secondary | ICD-10-CM | POA: Insufficient documentation

## 2024-09-29 DIAGNOSIS — Z171 Estrogen receptor negative status [ER-]: Secondary | ICD-10-CM

## 2024-09-29 DIAGNOSIS — M898X9 Other specified disorders of bone, unspecified site: Secondary | ICD-10-CM | POA: Insufficient documentation

## 2024-09-29 DIAGNOSIS — Z9011 Acquired absence of right breast and nipple: Secondary | ICD-10-CM | POA: Insufficient documentation

## 2024-09-29 NOTE — Progress Notes (Signed)
 [facility] CONSULT NOTE  Patient Care Team: Addie Camellia CROME, MD as PCP - General (Internal Medicine) Loretha Ash, MD as Consulting Physician (Hematology and Oncology) Dewey Rush, MD as Consulting Physician (Radiation Oncology) Curvin Deward MOULD, MD as Consulting Physician (General Surgery) Tyree Nanetta SAILOR, RN as Oncology Nurse Navigator  CHIEF COMPLAINTS/PURPOSE OF CONSULTATION:  Newly diagnosed breast cancer  HISTORY OF PRESENTING ILLNESS:  Deanna Rose 78 y.o. female is here because of recent diagnosis of right breast.  I reviewed her records extensively and collaborated the history with the patient.  SUMMARY OF ONCOLOGIC HISTORY: Oncology History  Malignant neoplasm of lower-outer quadrant of right breast of female, estrogen receptor negative (HCC)  01/28/2023 Mammogram   Bilateral diagnostic mammogram showed 2 indeterminate intraductal masses within the right breast potentially papillomas.  Alternative etiologies are not excluded.  Ultrasound-guided core needle biopsy of both right breast mass at 7 o'clock position 2 cm from the nipple and 7 o'clock position 1 cm from the nipple was recommended.   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  Similar pathology noted at right breast needle core biopsy 7:00 2 cm from the nipple.  Prognostics showed ER 0% negative PR 0% negative and Ki-67 of 60% from the first biopsy at 7:00 1 cm from the nipple.  HER2 was 1+ negative   02/11/2023 Initial Diagnosis   Malignant neoplasm of lower-outer quadrant of right breast of female, estrogen receptor negative (HCC)    This is a very pleasant 78 year old female patient with newly diagnosed right breast multifocal invasive ductal carcinoma, metaplastic, triple negative referred to  medical oncology.  She had right mastectomy which showed IDC with mucinous features areas of geographic  necrosis and focal chondroid metaplasia consistent with metaplastic  change, 4.5 x 4.0 x 2.5 cm, grade III/III.  Interval history  Discussed the use of AI scribe software for clinical note transcription with the patient, who gave verbal consent to proceed.  History of Present Illness Deanna Rose is an 78 year old female with breast cancer who presents for follow-up after surgery.  She experienced soreness in the right chest wall area, where she had surgery, a few weeks ago. The soreness resolved on its own, and she attributes it to possibly overusing her right hand. She has not required any hospital visits since her last visit and has been doing well.  No issues with breathing, bowel movements, or urination. She has not experienced any falls.  There have been no changes to her medication regimen. Rest of the pertinent 10 point ROS reviewed and neg.  MEDICAL HISTORY:  Past Medical History:  Diagnosis Date   Arthritis    Asthma    Blood clot in vein    leg   1966   Cancer (HCC)    UTERUS   1970S   GERD (gastroesophageal reflux disease)    Headache    Heart murmur    History of depression    without worsening   History of hiatal hernia    History of sickle cell trait    Hypertension    stable   Kidney disease    Low back pain    with degenerative disc disease by MRI   Mild obesity    Right flank pain    radicular   Stroke (HCC)  many years ago  1980s, left facial twitching takes botox     SURGICAL HISTORY: Past Surgical History:  Procedure Laterality Date   BILATERAL CARPAL TUNNEL RELEASE     BREAST BIOPSY Right 02/06/2023   US  RT BREAST BX W LOC DEV 1ST LESION IMG BX SPEC US  GUIDE 02/06/2023 [facility]   BREAST BIOPSY Right 02/06/2023   US  RT BREAST BX W LOC DEV EA ADD LESION IMG BX SPEC US  GUIDE 02/06/2023 [facility]   BREAST EXCISIONAL  BIOPSY Left 1970   BREAST SURGERY     bil breast bx    2x each   GLAUCOMA SURGERY     LEG SURGERY     rt leg blood clot removed 1966  [facility]   MASTECTOMY W/ SENTINEL NODE BIOPSY Right 03/17/2023   Procedure: MASTECTOMY WITH SENTINEL LYMPH NODE BIOPSY;  Surgeon: Curvin Deward MOULD, MD;  Location:  SURGERY CENTER;  Service: General;  Laterality: Right;   UTERINE FIBROID SURGERY     CANCER REMOVED    SOCIAL HISTORY: Social History   Socioeconomic History   Marital status: Divorced    Spouse name: Not on file   Number of children: 2   Years of education: Not on file   Highest education level: Not on file  Occupational History   Not on file  Tobacco Use   Smoking status: Former    Current packs/day: 0.00    Average packs/day: 0.3 packs/day for 12.0 years (3.0 ttl pk-yrs)    Types: Cigarettes    Start date: 50    Quit date: 35    Years since quitting: 55.9   Smokeless tobacco: Never  Vaping Use   Vaping status: Never Used  Substance and Sexual Activity   Alcohol use: No   Drug use: No   Sexual activity: Not on file  Other Topics Concern   Not on file  Social History Narrative   Not on file   Social Drivers of Health   Financial Resource Strain: Not on file  Food Insecurity: No Food Insecurity (02/12/2023)   Hunger Vital Sign    Worried About Running Out of Food in the Last Year: Never true    Ran Out of Food in the Last Year: Never true  Transportation Needs: No Transportation Needs (02/12/2023)   PRAPARE - Administrator, Civil Service (Medical): No    Lack of Transportation (Non-Medical): No  Physical Activity: Not on file  Stress: Not on file  Social Connections: Unknown (03/31/2022)   [facility]   Social Network    Social Network: Not on file  Intimate Partner Violence: Unknown (02/21/2022)   Received from [facility]   HITS    Physically Hurt: Not on file    Insult or Talk Down To: Not on file    Threaten Physical Harm:  Not on file    Scream or Curse: Not on file    FAMILY HISTORY: Family History  Problem Relation Age of Onset   Heart disease Mother    Hypertension Mother    Heart disease Father    Hypertension Father    Hypertension Sister    Heart disease Sister    Cancer Sister        unknown type   Thyroid cancer Daughter 61   Multiple myeloma Son 98    ALLERGIES:  is allergic to sulfa antibiotics.  MEDICATIONS:  Current Outpatient Medications  Medication Sig Dispense Refill   acetaminophen  (TYLENOL ) 500  MG tablet Take 1,000 mg by mouth every 8 (eight) hours as needed for mild pain or moderate pain.     amiloride-hydrochlorothiazide  (MODURETIC) 5-50 MG tablet Take 1 tablet by mouth daily.     amLODipine  (NORVASC ) 5 MG tablet Take 5 mg by mouth daily.     b complex vitamins tablet Take 1 tablet by mouth daily.     baclofen  (LIORESAL ) 10 MG tablet Take 10 mg by mouth 3 (three) times daily as needed for muscle spasms.      Calcium  Carb-Cholecalciferol (CALCIUM  + D3) 600-200 MG-UNIT TABS Take 1 tablet by mouth daily.     citalopram  (CELEXA ) 20 MG tablet Take 20 mg by mouth at bedtime.      fluticasone  (FLOVENT  HFA) 110 MCG/ACT inhaler Inhale 1 puff into the lungs 2 (two) times daily as needed.     gabapentin  (NEURONTIN ) 300 MG capsule Take 300 mg by mouth 2 (two) times daily.      magnesium  oxide (MAG-OX) 400 (240 Mg) MG tablet Take 1 tablet by mouth 2 (two) times daily.     memantine  (NAMENDA ) 10 MG tablet Take 10 mg by mouth at bedtime.     methocarbamol  (ROBAXIN ) 500 MG tablet Take 1 tablet (500 mg total) by mouth every 6 (six) hours as needed for muscle spasms. 30 tablet 1   metoCLOPramide  (REGLAN ) 10 MG tablet Take 10 mg by mouth daily.     montelukast  (SINGULAIR ) 10 MG tablet Take 10 mg by mouth daily.     Multiple Vitamins-Minerals (CENTRUM SILVER PO) Take 1 tablet by mouth daily.      omeprazole (PRILOSEC) 40 MG capsule Take 40 mg by mouth daily.     oxyCODONE  (ROXICODONE ) 5 MG  immediate release tablet Take 1 tablet (5 mg total) by mouth every 6 (six) hours as needed for severe pain. 10 tablet 0   rosuvastatin  (CRESTOR ) 5 MG tablet Take 5 mg by mouth at bedtime.     vitamin C  (ASCORBIC ACID ) 500 MG tablet Take 500 mg by mouth daily.     vitamin E 400 UNIT capsule Take 400 Units by mouth daily.     No current facility-administered medications for this visit.    REVIEW OF SYSTEMS:   Constitutional: Denies fevers, chills or abnormal night sweats Eyes: Denies blurriness of vision, double vision or watery eyes Ears, nose, mouth, throat, and face: Denies mucositis or sore throat Respiratory: Denies cough, dyspnea or wheezes Cardiovascular: Denies palpitation, chest discomfort or lower extremity swelling Gastrointestinal:  Denies nausea, heartburn or change in bowel habits Skin: Denies abnormal skin rashes Lymphatics: Denies new lymphadenopathy or easy bruising Neurological:Denies numbness, tingling or new weaknesses Behavioral/Psych: Mood is stable, no new changes  Breast: Denies any palpable lumps or discharge   PHYSICAL EXAMINATION: ECOG PERFORMANCE STATUS: 2 - Symptomatic, <50% confined to bed  Vitals:   09/29/24 1513  BP: (!) 143/63  Pulse: (!) 107  Resp: 18  Temp: 98.6 F (37 C)  SpO2: 95%   Filed Weights   09/29/24 1513  Weight: 150 lb 8 oz (68.3 kg)    GENERAL:alert, no distress and comfortable No concern for recurrence on breast exam No palpable regional adenopathy  LABORATORY DATA:  I have reviewed the data as listed Lab Results  Component Value Date   WBC 8.3 02/12/2023   HGB 12.6 02/12/2023   HCT 36.2 02/12/2023   MCV 87.4 02/12/2023   PLT 371 02/12/2023   Lab Results  Component Value Date   NA  138 03/14/2023   K 4.6 03/14/2023   CL 104 03/14/2023   CO2 22 03/14/2023    RADIOGRAPHIC STUDIES: I have personally reviewed the radiological reports and agreed with the findings in the report.  ASSESSMENT AND PLAN:  Assessment  and Plan Assessment & Plan Surveillance after right chest wall triple negative breast cancer Post-operative surveillance ongoing. No new lumps in left breast.  - Ordered left mammogram in one year from last. - Advised to report new bone pain, rib pain, weight loss, or feeling unwell. - Encouraged staying active, vit D supplementation for bone density.  Total time spent:20 minutes including history, physical exam, review of records, counseling and coordination of care. All questions were answered. The patient knows to call the clinic with any problems, questions or concerns.    Amber Stalls, MD 09/29/24

## 2025-09-30 ENCOUNTER — Inpatient Hospital Stay: Admitting: Hematology and Oncology
